# Patient Record
Sex: Female | Born: 1970 | Race: White | Hispanic: No | Marital: Married | Smoking: Never smoker
Health system: Southern US, Community
[De-identification: ages and names within clinical notes are randomized; demographics above are authoritative.]

## PROBLEM LIST (undated history)

## (undated) DIAGNOSIS — G43109 Migraine with aura, not intractable, without status migrainosus: Secondary | ICD-10-CM

## (undated) DIAGNOSIS — F319 Bipolar disorder, unspecified: Secondary | ICD-10-CM

## (undated) DIAGNOSIS — F329 Major depressive disorder, single episode, unspecified: Secondary | ICD-10-CM

## (undated) DIAGNOSIS — L4050 Arthropathic psoriasis, unspecified: Secondary | ICD-10-CM

## (undated) DIAGNOSIS — F32A Depression, unspecified: Secondary | ICD-10-CM

## (undated) DIAGNOSIS — G43909 Migraine, unspecified, not intractable, without status migrainosus: Secondary | ICD-10-CM

## (undated) DIAGNOSIS — F419 Anxiety disorder, unspecified: Secondary | ICD-10-CM

## (undated) DIAGNOSIS — U071 COVID-19: Secondary | ICD-10-CM

## (undated) DIAGNOSIS — I10 Essential (primary) hypertension: Secondary | ICD-10-CM

## (undated) HISTORY — DX: Bipolar disorder, unspecified: F31.9

## (undated) HISTORY — PX: TUBAL LIGATION: SHX77

## (undated) HISTORY — DX: Major depressive disorder, single episode, unspecified: F32.9

## (undated) HISTORY — DX: Depression, unspecified: F32.A

## (undated) HISTORY — DX: Migraine with aura, not intractable, without status migrainosus: G43.109

## (undated) HISTORY — PX: AUGMENTATION MAMMAPLASTY: SUR837

## (undated) HISTORY — DX: Anxiety disorder, unspecified: F41.9

## (undated) HISTORY — DX: Migraine, unspecified, not intractable, without status migrainosus: G43.909

## (undated) HISTORY — DX: Arthropathic psoriasis, unspecified: L40.50

## (undated) HISTORY — DX: COVID-19: U07.1

## (undated) HISTORY — PX: DILATION AND CURETTAGE OF UTERUS: SHX78

## (undated) HISTORY — DX: Essential (primary) hypertension: I10

## (undated) HISTORY — PX: SALPINGECTOMY: SHX328

---

## 2016-03-27 ENCOUNTER — Encounter: Payer: Self-pay | Admitting: Certified Nurse Midwife

## 2016-03-27 ENCOUNTER — Ambulatory Visit (INDEPENDENT_AMBULATORY_CARE_PROVIDER_SITE_OTHER): Payer: BLUE CROSS/BLUE SHIELD | Admitting: Certified Nurse Midwife

## 2016-03-27 ENCOUNTER — Other Ambulatory Visit: Payer: Self-pay | Admitting: Certified Nurse Midwife

## 2016-03-27 VITALS — BP 122/82 | HR 72 | Resp 16 | Ht 65.25 in | Wt 131.0 lb

## 2016-03-27 DIAGNOSIS — N946 Dysmenorrhea, unspecified: Secondary | ICD-10-CM

## 2016-03-27 DIAGNOSIS — Z3009 Encounter for other general counseling and advice on contraception: Secondary | ICD-10-CM | POA: Diagnosis not present

## 2016-03-27 DIAGNOSIS — Z23 Encounter for immunization: Secondary | ICD-10-CM

## 2016-03-27 DIAGNOSIS — Z124 Encounter for screening for malignant neoplasm of cervix: Secondary | ICD-10-CM | POA: Diagnosis not present

## 2016-03-27 DIAGNOSIS — Z9882 Breast implant status: Secondary | ICD-10-CM

## 2016-03-27 DIAGNOSIS — Z1231 Encounter for screening mammogram for malignant neoplasm of breast: Secondary | ICD-10-CM

## 2016-03-27 DIAGNOSIS — Z01419 Encounter for gynecological examination (general) (routine) without abnormal findings: Secondary | ICD-10-CM

## 2016-03-27 MED ORDER — ERRIN 0.35 MG PO TABS: 1.0000 | ORAL_TABLET | Freq: Every day | ORAL | 1 refills | Status: DC

## 2016-03-27 NOTE — Patient Instructions (Signed)

## 2016-03-27 NOTE — Progress Notes (Signed)
Encounter reviewed Jill Jertson, MD   

## 2016-03-27 NOTE — Progress Notes (Signed)
45 y.o. [id number]( one tubal pregnancy with left salpingectomy) Divorced  Caucasian Fe here to establish gyn care and for annual exam. Has just moved from [place] area. Establishing with MD practice soon. Periods scant to spotting, no regularity, but no migraine headaches or cramping with use, desires continuance. History of dysmenorrhea and menstrual migraine. No partner change, no STD screening desired or needed. Bipolar history with psychiatry management has established with one visit now in [place]. Sees rheumatology for psoriatic arthritis,has appointment scheduled. Plans to establish with for aex and labs soon. Declines any labs today. Will be starting new job in area soon.. No other health issues today. [employer] so far.  Patient's last menstrual period was 03/27/2016 (exact date).          Sexually active: Yes.    The current method of family planning is tubal ligation.    Exercising: Yes.    yoga Smoker:  yes  Health Maintenance: Pap:  8/16 neg per patient no abnormal pap smears. MMG:  8/16 neg per patient no abnormal mammograms Colonoscopy:  none BMD:   none TDaP: greater than 10 years request today. Shingles: no Pneumonia: no Hep C and HIV: both neg in the past Labs: none Self breast exam: done occ   reports that she has been smoking.  She has never used smokeless tobacco. She reports that she uses drugs, including Marijuana.  Past Medical History:  Diagnosis Date  . Anxiety   . Bipolar disorder ([place])   . Depression   . Migraines   . Psoriatic arthritis [facility])     Past Surgical History:  Procedure Laterality Date  . AUGMENTATION MAMMAPLASTY    . CESAREAN SECTION    . DILATION AND CURETTAGE OF UTERUS    . SALPINGECTOMY     left side  . TUBAL LIGATION      Current Outpatient Prescriptions  Medication Sig Dispense Refill  . almotriptan (AXERT) 12.5 MG tablet   4  . amphetamine-dextroamphetamine (ADDERALL) 20 MG tablet   0  . buPROPion (WELLBUTRIN SR) 100  MG 12 hr tablet Take 100 mg by mouth daily.    Marland Kitchen CIMZIA PREFILLED 2 X 200 MG/ML KIT   2  . clonazePAM (KLONOPIN) 1 MG tablet 2 at hs  3  . ERRIN 0.35 MG tablet   0  . lithium 300 MG tablet 1 in the am & 2 at hs  4  . LORazepam (ATIVAN) 1 MG tablet 1-2 twice daily  0  . MAGNESIUM PO Take 500 mg by mouth daily.    . methotrexate (RHEUMATREX) 2.5 MG tablet 5 pills once a week  0  . REXULTI 3 MG TABS   2   No current facility-administered medications for this visit.     History reviewed. No pertinent family history.  ROS:  Pertinent items are noted in HPI.  Otherwise, a comprehensive ROS was negative.  Exam:   BP 122/82   Pulse 72   Resp 16   Ht 5' 5.25" (1.657 m)   Wt 131 lb (59.4 kg)   LMP 03/27/2016 (Exact Date)   BMI 21.63 kg/m  Height: 5' 5.25" (165.7 cm) Ht Readings from Last 3 Encounters:  03/27/16 5' 5.25" (1.657 m)    General appearance: alert, cooperative and appears stated age Head: Normocephalic, without obvious abnormality, atraumatic Neck: no adenopathy, supple, symmetrical, trachea midline and thyroid normal to inspection and palpation Lungs: clear to auscultation bilaterally Breasts: normal appearance, no masses or tenderness, No nipple retraction or  dimpling, No nipple discharge or bleeding, No axillary or supraclavicular adenopathy, bilateral saline implants, appear intact Heart: regular rate and rhythm Abdomen: soft, non-tender; no masses,  no organomegaly Extremities: extremities normal, atraumatic, no cyanosis or edema Skin: Skin color, texture, turgor normal. No rashes or lesions Lymph nodes: Cervical, supraclavicular, and axillary nodes normal. No abnormal inguinal nodes palpated Neurologic: Grossly normal   Pelvic: External genitalia:  no lesions              Urethra:  normal appearing urethra with no masses, tenderness or lesions              Bartholin's and Skene's: normal                 Vagina: normal appearing vagina with normal color and  discharge, no lesions              Cervix: multiparous appearance, no cervical motion tenderness, no lesions and light menses noted              Pap taken: Yes.   Bimanual Exam:  Uterus:  normal size, contour, position, consistency, mobility, non-tender and retroverted              Adnexa: normal adnexa and no mass, fullness, tenderness               Rectovaginal: Confirms               Anus:  normal sphincter tone, no lesions  Chaperone present: yes  A:  Well Woman with normal exam  Contraception Tubal ligation,uses POP with migraine headache and dysmenorrhea control, desires continuance  Psoratic Arthritis with Rhuematology management, establishing care  Bipolar/depression management with Psychiatrist management has established care  Mammogram due given information to schedule  Immunization update  P:   Reviewed health and wellness pertinent to exam  Discussed risks and benefits of POP and side effects/bleeding profile with POP.Marland Kitchen Patient voiced understanding and would like to continue. Discussed current mammogram needed, but will renew for the next month(she has one refill).   Rx Errin see order with instructions  Continue follow up as indicated with MD's  Discussed risks and benefits of TDAP requests today.  Pap smear as above with HPVHR   counseled on breast self exam, mammography screening, STD prevention, HIV risk factors and prevention, adequate intake of calcium and vitamin D, diet and exercise  return annually or prn  An After Visit Summary was printed and given to the patient.

## 2016-03-29 ENCOUNTER — Ambulatory Visit
Admission: RE | Admit: 2016-03-29 | Discharge: 2016-03-29 | Disposition: A | Discharge status: Home / Self Care | Payer: BLUE CROSS/BLUE SHIELD | Source: Ambulatory Visit | Attending: Certified Nurse Midwife | Admitting: Certified Nurse Midwife

## 2016-03-29 ENCOUNTER — Ambulatory Visit: Payer: Self-pay

## 2016-03-29 DIAGNOSIS — Z9882 Breast implant status: Secondary | ICD-10-CM

## 2016-03-29 DIAGNOSIS — Z1231 Encounter for screening mammogram for malignant neoplasm of breast: Secondary | ICD-10-CM

## 2016-03-29 LAB — IPS PAP TEST WITH HPV

## 2016-04-03 DIAGNOSIS — Z8669 Personal history of other diseases of the nervous system and sense organs: Secondary | ICD-10-CM | POA: Insufficient documentation

## 2016-04-03 DIAGNOSIS — L4050 Arthropathic psoriasis, unspecified: Secondary | ICD-10-CM | POA: Insufficient documentation

## 2016-04-03 DIAGNOSIS — F319 Bipolar disorder, unspecified: Secondary | ICD-10-CM | POA: Insufficient documentation

## 2016-06-04 ENCOUNTER — Other Ambulatory Visit: Payer: Self-pay | Admitting: Certified Nurse Midwife

## 2016-06-04 DIAGNOSIS — N946 Dysmenorrhea, unspecified: Secondary | ICD-10-CM

## 2016-06-04 DIAGNOSIS — Z3009 Encounter for other general counseling and advice on contraception: Secondary | ICD-10-CM

## 2016-06-05 NOTE — Telephone Encounter (Signed)
Mammo has been done & is normal.

## 2016-06-05 NOTE — Telephone Encounter (Signed)
Medication refill request: Courtney Valentine Last AEX:  03/27/16 DL Next AEX: 04/03/17 DL Last MMG (if hormonal medication request): 03/29/17 BIRADS1, Density C[facility]  Refill authorized: 03/27/16 #1 Package 1R. Please advise. Thank you.   Routing to PG since DL is out of the office.

## 2016-10-10 ENCOUNTER — Encounter: Payer: Self-pay | Admitting: Certified Nurse Midwife

## 2016-10-10 ENCOUNTER — Ambulatory Visit (INDEPENDENT_AMBULATORY_CARE_PROVIDER_SITE_OTHER): Payer: PRIVATE HEALTH INSURANCE | Admitting: Certified Nurse Midwife

## 2016-10-10 VITALS — BP 118/80 | HR 64 | Resp 16 | Ht 65.25 in | Wt 126.0 lb

## 2016-10-10 DIAGNOSIS — N898 Other specified noninflammatory disorders of vagina: Secondary | ICD-10-CM | POA: Diagnosis not present

## 2016-10-10 DIAGNOSIS — N951 Menopausal and female climacteric states: Secondary | ICD-10-CM | POA: Diagnosis not present

## 2016-10-10 LAB — FOLLICLE STIMULATING HORMONE: FSH: 4.6 m[IU]/mL

## 2016-10-10 NOTE — Patient Instructions (Signed)
Perimenopause Perimenopause is the time when your body begins to move into the menopause (no menstrual period for 12 straight months). It is a natural process. Perimenopause can begin 2-8 years before the menopause and usually lasts for 1 year after the menopause. During this time, your ovaries may or may not produce an egg. The ovaries vary in their production of estrogen and progesterone hormones each month. This can cause irregular menstrual periods, difficulty getting pregnant, vaginal bleeding between periods, and uncomfortable symptoms. What are the causes?  Irregular production of the ovarian hormones, estrogen and progesterone, and not ovulating every month. Other causes include:  Tumor of the pituitary gland in the brain.  Medical disease that affects the ovaries.  Radiation treatment.  Chemotherapy.  Unknown causes.  Heavy smoking and excessive alcohol intake can bring on perimenopause sooner. What are the signs or symptoms?  Hot flashes.  Night sweats.  Irregular menstrual periods.  Decreased sex drive.  Vaginal dryness.  Headaches.  Mood swings.  Depression.  Memory problems.  Irritability.  Tiredness.  Weight gain.  Trouble getting pregnant.  The beginning of losing bone cells (osteoporosis).  The beginning of hardening of the arteries (atherosclerosis). How is this diagnosed? Your health care provider will make a diagnosis by analyzing your age, menstrual history, and symptoms. He or she will do a physical exam and note any changes in your body, especially your female organs. Female hormone tests may or may not be helpful depending on the amount of female hormones you produce and when you produce them. However, other hormone tests may be helpful to rule out other problems. How is this treated? In some cases, no treatment is needed. The decision on whether treatment is necessary during the perimenopause should be made by you and your health care  provider based on how the symptoms are affecting you and your lifestyle. Various treatments are available, such as:  Treating individual symptoms with a specific medicine for that symptom.  Herbal medicines that can help specific symptoms.  Counseling.  Group therapy. Follow these instructions at home:  Keep track of your menstrual periods (when they occur, how heavy they are, how long between periods, and how long they last) as well as your symptoms and when they started.  Only take over-the-counter or prescription medicines as directed by your health care provider.  Sleep and rest.  Exercise.  Eat a diet that contains calcium (good for your bones) and soy (acts like the estrogen hormone).  Do not smoke.  Avoid alcoholic beverages.  Take vitamin supplements as recommended by your health care provider. Taking vitamin E may help in certain cases.  Take calcium and vitamin D supplements to help prevent bone loss.  Group therapy is sometimes helpful.  Acupuncture may help in some cases. Contact a health care provider if:  You have questions about any symptoms you are having.  You need a referral to a specialist (gynecologist, psychiatrist, or psychologist). Get help right away if:  You have vaginal bleeding.  Your period lasts longer than 8 days.  Your periods are recurring sooner than 21 days.  You have bleeding after intercourse.  You have severe depression.  You have pain when you urinate.  You have severe headaches.  You have vision problems. This information is not intended to replace advice given to you by your health care provider. Make sure you discuss any questions you have with your health care provider. Document Released: 07/18/2004 Document Revised: 11/16/2015 Document Reviewed: 01/07/2013 Elsevier Interactive  Patient Education  2017 [employer].

## 2016-10-10 NOTE — Progress Notes (Signed)
46 y.o. Divorced Caucasian female [phone] here with complaint of vaginal symptoms of dryness and no moisture with sexual activity or with organisms. Uses lubricant without change.  Onset about 9 months ago and has gotten progressively worse. Same partner and his has also noted change. Back on Methotrexate, and Remicade. Continues on POP for Migraine prevention. Also on Lithium and Wellbutrin. No hot flashes or night sweats. Still has some brownish discharge once monthly and sometimes midcycle. No cramping with POP. Contraception  Salpingectomy. Has also noted dry eyes and mouth more frequently. Denies vaginal itching and odor. No other health issues today.  ROS; Pertinent to HPI  O:Healthy female WDWN Affect: normal, orientation x 3  Exam:Skin: warm and dry Abdomen:soft, non tender Inguinal Lymph nodes: no enlargement or tenderness Pelvic exam: External genital: normal female with dryness and slight scaling of around introitus, no lesions, slight redness, non tender BUS: negative Vagina: cottage cheese appearing discharge noted in posterior fornix, no odor. , Affirm taken Cervix: normal, non tender, no CMT Uterus: normal, non tender Adnexa:normal, non tender, no masses or fullness noted   A:Normal pelvic exam R/O vaginal infection Perimenopausal symptoms vs side effects of medication profile Vaginal dryness Oral and eye dryness   P:Discussed findings of vaginal dryness and increase vaginal discharge and etiology. Discussed medications can also increase dryness and decrease libido. Encouraged to discuss eye and oral dryness with Rheumatologist. Discussed OTC Biotene for oral dryness and Systane eye drops and can discuss with pharmacist also. Discussed etiology of perimenopausal changes and expectations.Pamphlet given. Also discussed POP use can also contribute to dryness, but feel her migraine headache prevention is also important..Discussed OTC products and coconut oil use for dryness and  lubrication. Instructions given .Questions addressed at length. Lab: Affirm, FSH  Rv prn

## 2016-10-11 ENCOUNTER — Telehealth: Payer: Self-pay

## 2016-10-11 LAB — WET PREP BY MOLECULAR PROBE
Candida species: NOT DETECTED
Gardnerella vaginalis: NOT DETECTED
Trichomonas vaginosis: NOT DETECTED

## 2016-10-11 NOTE — Telephone Encounter (Signed)
lmtcb

## 2016-10-11 NOTE — Telephone Encounter (Signed)
-----   Message from Regina Eck, CNM sent at 10/11/2016  8:16 AM EDT ----- Notify patient that [facility] does not show menopause or peri menopause, so hormonally normal.  Wet prep negative for yeast, BV and trichomonas. Feel vaginal dryness is the issue. Try the coconut oil we discussed if not change please advise. Her anxiety medications will cause decrease libido. This generally listed in the side effect profile on her RX handouts from pharmacy.

## 2016-10-11 NOTE — Telephone Encounter (Signed)
Patient notified of results. See lab

## 2016-10-12 NOTE — Progress Notes (Signed)
Encounter reviewed. If she doesn't improve, you could consider a trial of vaginal estrogen.  Sumner Boast, MD

## 2017-02-26 DIAGNOSIS — IMO0002 Reserved for concepts with insufficient information to code with codable children: Secondary | ICD-10-CM | POA: Insufficient documentation

## 2017-02-26 DIAGNOSIS — G43709 Chronic migraine without aura, not intractable, without status migrainosus: Secondary | ICD-10-CM | POA: Insufficient documentation

## 2017-04-03 ENCOUNTER — Ambulatory Visit: Payer: BLUE CROSS/BLUE SHIELD | Admitting: Certified Nurse Midwife

## 2017-05-08 ENCOUNTER — Other Ambulatory Visit: Payer: Self-pay

## 2017-05-08 DIAGNOSIS — Z3009 Encounter for other general counseling and advice on contraception: Secondary | ICD-10-CM

## 2017-05-08 DIAGNOSIS — N946 Dysmenorrhea, unspecified: Secondary | ICD-10-CM

## 2017-05-08 MED ORDER — ERRIN 0.35 MG PO TABS: 1.0000 | ORAL_TABLET | Freq: Every day | ORAL | 0 refills | Status: DC

## 2017-05-08 NOTE — Telephone Encounter (Signed)
Medication refill request: ERRIN Last AEX:  03/27/16 DL Next AEX: 05/13/17 Last MMG (if hormonal medication request): 03/29/16 BIRADS 1 negative/density c Refill authorized: 06/05/16 #3 w/3 refills; today please advise. Spoke with patient, per patient has enough pills until Sunday

## 2017-05-13 ENCOUNTER — Ambulatory Visit: Payer: PRIVATE HEALTH INSURANCE | Admitting: Certified Nurse Midwife

## 2017-05-13 ENCOUNTER — Encounter: Payer: Self-pay | Admitting: Certified Nurse Midwife

## 2017-05-13 NOTE — Progress Notes (Deleted)
46 y.o. [id number] Divorced  {Race/ethnicity:17218} Fe here for annual exam.    No LMP recorded. Patient is not currently having periods (Reason: Oral contraceptives).          Sexually active: {yes no:314532}  The current method of family planning is tubal ligation.    Exercising: {yes no:314532}  {types:19826} Smoker:  {YES NO:22349}  Health Maintenance: Pap:  8/16 neg per patient, 03-27-16 neg HPV HR neg History of Abnormal Pap: no MMG:  03-29-16 category c density birads 1:neg Self Breast exams: {YES NO:22349} Colonoscopy:  none BMD:   none TDaP:  *** Shingles: *** Pneumonia: *** Hep C and HIV: *** Labs: ***   reports that she has been smoking.  she has never used smokeless tobacco. She reports that she uses drugs. Drug: Marijuana.  Past Medical History:  Diagnosis Date  . Anxiety   . Bipolar disorder ([place])   . Depression   . Migraines   . Psoriatic arthritis [facility])     Past Surgical History:  Procedure Laterality Date  . AUGMENTATION MAMMAPLASTY    . CESAREAN SECTION    . DILATION AND CURETTAGE OF UTERUS    . SALPINGECTOMY     left side  . TUBAL LIGATION      Current Outpatient Medications  Medication Sig Dispense Refill  . almotriptan (AXERT) 12.5 MG tablet   4  . amphetamine-dextroamphetamine (ADDERALL) 20 MG tablet   0  . buPROPion (WELLBUTRIN SR) 100 MG 12 hr tablet Take 100 mg by mouth daily.    . clonazePAM (KLONOPIN) 1 MG tablet 2 at hs  3  . ERRIN 0.35 MG tablet Take 1 tablet (0.35 mg total) daily by mouth. 1 Package 0  . inFLIXimab (REMICADE) 100 MG injection     . lithium 300 MG tablet 1 in the am & 2 at hs  4  . MAGNESIUM PO Take 500 mg by mouth daily.    . methotrexate (RHEUMATREX) 2.5 MG tablet 5 pills once a week  0  . traZODone (DESYREL) 50 MG tablet Take 50 mg by mouth at bedtime.     No current facility-administered medications for this visit.     Family History  Problem Relation Age of Onset  . Diabetes Mother   . Hypertension Mother   .  Diabetes Father   . Hypertension Father   . Heart disease Maternal Grandfather   . Heart attack Maternal Grandfather   . Lung cancer Paternal Grandfather     ROS:  Pertinent items are noted in HPI.  Otherwise, a comprehensive ROS was negative.  Exam:   There were no vitals taken for this visit.   Ht Readings from Last 3 Encounters:  10/10/16 5' 5.25" (1.657 m)  03/27/16 5' 5.25" (1.657 m)    General appearance: alert, cooperative and appears stated age Head: Normocephalic, without obvious abnormality, atraumatic Neck: no adenopathy, supple, symmetrical, trachea midline and thyroid {EXAM; THYROID:18604} Lungs: clear to auscultation bilaterally Breasts: {Exam; breast:13139::"normal appearance, no masses or tenderness"} Heart: regular rate and rhythm Abdomen: soft, non-tender; no masses,  no organomegaly Extremities: extremities normal, atraumatic, no cyanosis or edema Skin: Skin color, texture, turgor normal. No rashes or lesions Lymph nodes: Cervical, supraclavicular, and axillary nodes normal. No abnormal inguinal nodes palpated Neurologic: Grossly normal   Pelvic: External genitalia:  no lesions              Urethra:  normal appearing urethra with no masses, tenderness or lesions  Bartholin's and Skene's: normal                 Vagina: normal appearing vagina with normal color and discharge, no lesions              Cervix: {exam; cervix:14595}              Pap taken: {yes no:314532} Bimanual Exam:  Uterus:  {exam; uterus:12215}              Adnexa: {exam; adnexa:12223}               Rectovaginal: Confirms               Anus:  normal sphincter tone, no lesions  Chaperone present: ***  A:  Well Woman with normal exam  P:   Reviewed health and wellness pertinent to exam  Pap smear: {YES NO:22349}  {plan; gyn:5269::"mammogram","pap smear","return annually or prn"}  An After Visit Summary was printed and given to the patient.

## 2017-07-04 ENCOUNTER — Other Ambulatory Visit: Payer: Self-pay | Admitting: Certified Nurse Midwife

## 2017-07-04 DIAGNOSIS — Z1231 Encounter for screening mammogram for malignant neoplasm of breast: Secondary | ICD-10-CM

## 2017-07-15 ENCOUNTER — Other Ambulatory Visit: Payer: Self-pay

## 2017-07-15 ENCOUNTER — Ambulatory Visit (INDEPENDENT_AMBULATORY_CARE_PROVIDER_SITE_OTHER): Payer: PRIVATE HEALTH INSURANCE | Admitting: Certified Nurse Midwife

## 2017-07-15 ENCOUNTER — Other Ambulatory Visit (HOSPITAL_COMMUNITY)
Admission: RE | Admit: 2017-07-15 | Discharge: 2017-07-15 | Disposition: A | Discharge status: Home / Self Care | Payer: PRIVATE HEALTH INSURANCE | Source: Ambulatory Visit | Attending: Certified Nurse Midwife | Admitting: Certified Nurse Midwife

## 2017-07-15 ENCOUNTER — Encounter: Payer: Self-pay | Admitting: Certified Nurse Midwife

## 2017-07-15 VITALS — BP 104/60 | HR 70 | Resp 16 | Ht 65.75 in | Wt 140.0 lb

## 2017-07-15 DIAGNOSIS — Z01419 Encounter for gynecological examination (general) (routine) without abnormal findings: Secondary | ICD-10-CM

## 2017-07-15 DIAGNOSIS — R6882 Decreased libido: Secondary | ICD-10-CM | POA: Diagnosis not present

## 2017-07-15 DIAGNOSIS — Z872 Personal history of diseases of the skin and subcutaneous tissue: Secondary | ICD-10-CM

## 2017-07-15 DIAGNOSIS — Z124 Encounter for screening for malignant neoplasm of cervix: Secondary | ICD-10-CM

## 2017-07-15 NOTE — Progress Notes (Signed)
47 y.o. [id number] Divorced  Caucasian Fe here for annual exam. Stopped OCP mid December and period in January was normal for her. She had not been having any period with OCP use other than increase brown discharge. Was using OCP for cycle control and headache control. Feels better being off OCP now. Sees Roe Coombs PCP for aex labs, hypertension,arthritis management and Dr. Toy Care for medication management of depression and Bipolar and migraine headache. All medications stable per patient. Complaining of bump in vulva area, chronic, no redness or pain in area. Has been using warm tub baths for relief and feels better now. She shaves in this area and notices this more frequently with shaving. Some decrease in libido, but aware her medications can increase the risk for this. No other health concerns today.  Patient's last menstrual period was 07/01/2017 (exact date).          Sexually active: Yes.    The current method of family planning is tubal ligation.    Exercising: No.  exercise Smoker:  no  Health Maintenance: Pap:  8/16 neg,03-27-16 neg HPV HR neg History of Abnormal Pap: no MMG:  03-29-16 category c density birads 1:neg Self Breast exams: no Colonoscopy:  none BMD:   none TDaP:  2017 Shingles: no Pneumonia: no Hep C and HIV: both neg in the past per patient Labs: with PCP   reports that she has quit smoking. she has never used smokeless tobacco. She reports that she does not drink alcohol or use drugs.  Past Medical History:  Diagnosis Date  . Anxiety   . Bipolar disorder ([place])   . Depression   . Hypertension   . Migraines   . Migraines   . Psoriatic arthritis [facility])     Past Surgical History:  Procedure Laterality Date  . AUGMENTATION MAMMAPLASTY    . CESAREAN SECTION    . DILATION AND CURETTAGE OF UTERUS    . SALPINGECTOMY     left side  . TUBAL LIGATION      Current Outpatient Medications  Medication Sig Dispense Refill  . almotriptan (AXERT) 12.5 MG tablet   4  .  amphetamine-dextroamphetamine (ADDERALL XR) 30 MG 24 hr capsule Take by mouth.    Marland Kitchen atenolol (TENORMIN) 25 MG tablet Take 1 pill BID    . baclofen (LIORESAL) 10 MG tablet Take by mouth 2 (two) times daily.    . Botulinum Toxin Type A (BOTOX) 200 units SOLR     . buPROPion (WELLBUTRIN SR) 100 MG 12 hr tablet Take 100 mg by mouth daily.    . clonazePAM (KLONOPIN) 1 MG tablet 2 at hs  3  . diclofenac (VOLTAREN) 25 MG EC tablet Take 1 pill up to q12h prn severe Headache    . inFLIXimab (REMICADE) 100 MG injection     . LATUDA 40 MG TABS tablet   1  . lithium 300 MG tablet 2 in the am & 2 at hs  4  . MAGNESIUM PO Take 500 mg by mouth daily.    Marland Kitchen OLANZapine-FLUoxetine (SYMBYAX) 3-25 MG capsule     . traZODone (DESYREL) 50 MG tablet Take 50 mg by mouth at bedtime.     No current facility-administered medications for this visit.     Family History  Problem Relation Age of Onset  . Diabetes Mother   . Hypertension Mother   . Diabetes Father   . Hypertension Father   . Alzheimer's disease Father   . Heart disease Maternal Grandfather   .  Heart attack Maternal Grandfather   . Lung cancer Paternal Grandfather     ROS:  Pertinent items are noted in HPI.  Otherwise, a comprehensive ROS was negative.  Exam:   BP 104/60   Pulse 70   Resp 16   Ht 5' 5.75" (1.67 m)   Wt 140 lb (63.5 kg)   LMP 07/01/2017 (Exact Date)   BMI 22.77 kg/m  Height: 5' 5.75" (167 cm) Ht Readings from Last 3 Encounters:  07/15/17 5' 5.75" (1.67 m)  10/10/16 5' 5.25" (1.657 m)  03/27/16 5' 5.25" (1.657 m)    General appearance: alert, cooperative and appears stated age Head: Normocephalic, without obvious abnormality, atraumatic Neck: no adenopathy, supple, symmetrical, trachea midline and thyroid normal to inspection and palpation Lungs: clear to auscultation bilaterally Breasts: normal appearance, no masses or tenderness, No nipple retraction or dimpling, No nipple discharge or bleeding, No axillary or  supraclavicular adenopathy Heart: regular rate and rhythm Abdomen: soft, non-tender; no masses,  no organomegaly Extremities: extremities normal, atraumatic, no cyanosis or edema Skin: Skin color, texture, turgor normal. No rashes or lesions Lymph nodes: Cervical, supraclavicular, and axillary nodes normal. No abnormal inguinal nodes palpated Neurologic: Grossly normal   Pelvic: External genitalia:  no lesions              Urethra:  normal appearing urethra with no masses, tenderness or lesions              Bartholin's and Skene's: normal                 Vagina: normal appearing vagina with normal color and discharge, no lesions              Cervix: no cervical motion tenderness and no lesions              Pap taken: Yes.   Bimanual Exam:  Uterus:  normal size, contour, position, consistency, mobility, non-tender              Adnexa: normal adnexa and no mass, fullness, tenderness               Rectovaginal: Confirms               Anus:  normal sphincter tone, no lesions  Chaperone present: yes  A:  Well Woman with normal exam  Contraception tubal  Previous OCP use for cycle control and headache management, stopped and having no issues at present. Does not desire restart.   Decrease libido  Hypertension,arthritis, bipolar,depression , migraine headache with MD management  P:   Reviewed health and wellness pertinent to exam  Discussed decrease libido and no medication that has been found to help with this. Discussed testosterone trial,but patient not interested. Discussed [facility] sexual therapy as option or trying for date night periodically and discussing her concerns with spouse. Patient will advise if needs further advise.  Continue follow up as indicated with PCP  Pap smear: no   counseled on breast self exam, mammography screening, feminine hygiene, adequate intake of calcium and vitamin D, diet and exercise  return annually or prn  An After Visit Summary was printed and  given to the patient.

## 2017-07-15 NOTE — Patient Instructions (Signed)

## 2017-07-16 LAB — CYTOLOGY - PAP
Diagnosis: NEGATIVE
HPV: NOT DETECTED

## 2017-07-28 ENCOUNTER — Ambulatory Visit
Admission: RE | Admit: 2017-07-28 | Discharge: 2017-07-28 | Disposition: A | Discharge status: Home / Self Care | Payer: PRIVATE HEALTH INSURANCE | Source: Ambulatory Visit | Attending: Certified Nurse Midwife | Admitting: Certified Nurse Midwife

## 2017-07-28 DIAGNOSIS — Z1231 Encounter for screening mammogram for malignant neoplasm of breast: Secondary | ICD-10-CM

## 2018-02-16 ENCOUNTER — Other Ambulatory Visit: Payer: Self-pay | Admitting: Internal Medicine

## 2018-02-16 ENCOUNTER — Ambulatory Visit
Admission: RE | Admit: 2018-02-16 | Discharge: 2018-02-16 | Disposition: A | Discharge status: Home / Self Care | Payer: PRIVATE HEALTH INSURANCE | Source: Ambulatory Visit | Attending: Internal Medicine | Admitting: Internal Medicine

## 2018-02-16 DIAGNOSIS — M7989 Other specified soft tissue disorders: Secondary | ICD-10-CM

## 2018-02-16 DIAGNOSIS — M79602 Pain in left arm: Secondary | ICD-10-CM

## 2018-10-19 ENCOUNTER — Ambulatory Visit (INDEPENDENT_AMBULATORY_CARE_PROVIDER_SITE_OTHER): Payer: PRIVATE HEALTH INSURANCE | Admitting: Cardiology

## 2018-10-19 ENCOUNTER — Other Ambulatory Visit: Payer: Self-pay

## 2018-10-19 ENCOUNTER — Encounter: Payer: Self-pay | Admitting: Cardiology

## 2018-10-19 VITALS — BP 138/72 | Ht 65.0 in | Wt 155.0 lb

## 2018-10-19 DIAGNOSIS — R5383 Other fatigue: Secondary | ICD-10-CM | POA: Diagnosis not present

## 2018-10-19 DIAGNOSIS — I10 Essential (primary) hypertension: Secondary | ICD-10-CM | POA: Diagnosis not present

## 2018-10-19 DIAGNOSIS — R0609 Other forms of dyspnea: Secondary | ICD-10-CM

## 2018-10-19 DIAGNOSIS — L4050 Arthropathic psoriasis, unspecified: Secondary | ICD-10-CM

## 2018-10-19 NOTE — Progress Notes (Signed)
Subjective:   Courtney Valentine, female    DOB: 09/20/1970, 48 y.o.   MRN: [id number]  Chief Complaint  Patient presents with  . Shortness of Breath  . Fatigue  . New Patient (Initial Visit)   This visit type was conducted due to national recommendations for restrictions regarding the COVID-19 Pandemic (e.g. social distancing).  This format is felt to be most appropriate for this patient at this time.  All issues noted in this document were discussed and addressed.  No physical exam was performed (except for noted visual exam findings with Telehealth visits).  The patient has consented to conduct a Telehealth visit and understands insurance will be billed.   I discussed the limitations of evaluation and management by telemedicine and the availability of in person appointments. The patient expressed understanding and agreed to proceed.  Virtual Visit via Video Note is as below  I connected with Courtney Valentine, on 10/20/18 at 1130 by a video enabled telemedicine application and verified that I am speaking with the correct person using two identifiers.     I have discussed with her regarding the safety during COVID Pandemic and steps and precautions including social distancing with the patient.    Patient referred by Roe Coombs C for fatigue and dyspnea on exertion  HPI:  Courtney Valentine is a 48 y.o. female with bipolar disorder, psoriatic arthritis on Remicade, chronic migraines, and hypertension.  Patient was recently evaluated by her PCP and mentioned shortness of breath on exertion particularly with climbing stairs. Does notice that her previously tolerated home exercises, she now has to stop and rest. Symptoms started a few months ago and have continued to progress. No chest pain. No PND or orthopnea. Also having fatigue that was felt to be related to side effect from her atenolol as dosage was recently changed and was noted to be bradycardic in PCP office; however, fatigue  has continued with going back to twice a day dose. She was also recently found to have anemia without known cause. Denies any bleeding. Does have chronic palpitations that last for a few seconds and describes as heart racing. Palpitations have been present for the last 1 year and have not recently worsened. Occur sporadically.  She currently has referral to pulmonary for evaluation of possible sleep apnea as she has started snoring over the last year. States that she is exhausted all the time and does not wake up feeling well rested. She is scheduled for a visit with Dr. Michela Pitcher with [facility] tomorrow for evaluation. She has history of hypertension that has been fairly well controlled. Denies any hyperlipidemia, diabetes, or thyroid disorders.  Past Medical History:  Diagnosis Date  . Anxiety   . Bipolar disorder ([place])   . Depression   . Hypertension   . Migraines   . Migraines   . Psoriatic arthritis [facility])     Past Surgical History:  Procedure Laterality Date  . AUGMENTATION MAMMAPLASTY Bilateral   . CESAREAN SECTION    . DILATION AND CURETTAGE OF UTERUS    . SALPINGECTOMY     left side  . TUBAL LIGATION      Family History  Problem Relation Age of Onset  . Diabetes Mother   . Hypertension Mother   . Diabetes Father   . Hypertension Father   . Alzheimer's disease Father   . Heart disease Maternal Grandfather   . Heart attack Maternal Grandfather   . Lung cancer Paternal Grandfather  Social History   Socioeconomic History  . Marital status: Married    Spouse name: Not on file  . Number of children: 2  . Years of education: Not on file  . Highest education level: Not on file  Occupational History  . Not on file  Social Needs  . Financial resource strain: Not on file  . Food insecurity:    Worry: Not on file    Inability: Not on file  . Transportation needs:    Medical: Not on file    Non-medical: Not on file  Tobacco Use  . Smoking status: Never Smoker   . Smokeless tobacco: Never Used  Substance and Sexual Activity  . Alcohol use: No    Frequency: Never  . Drug use: No  . Sexual activity: Yes    Partners: Male    Birth control/protection: Surgical    Comment: BTL  Lifestyle  . Physical activity:    Days per week: Not on file    Minutes per session: Not on file  . Stress: Not on file  Relationships  . Social connections:    Talks on phone: Not on file    Gets together: Not on file    Attends religious service: Not on file    Active member of club or organization: Not on file    Attends meetings of clubs or organizations: Not on file    Relationship status: Not on file  . Intimate partner violence:    Fear of current or ex partner: Not on file    Emotionally abused: Not on file    Physically abused: Not on file    Forced sexual activity: Not on file  Other Topics Concern  . Not on file  Social History Narrative  . Not on file    Current Outpatient Medications on File Prior to Visit  Medication Sig Dispense Refill  . almotriptan (AXERT) 12.5 MG tablet as needed.   4  . amphetamine-dextroamphetamine (ADDERALL XR) 30 MG 24 hr capsule Take by mouth daily.     Marland Kitchen atenolol (TENORMIN) 25 MG tablet Take 1 pill BID    . Botulinum Toxin Type A (BOTOX) 200 units SOLR Every 3 months    . buPROPion (WELLBUTRIN SR) 100 MG 12 hr tablet Take 100 mg by mouth daily.    . clonazePAM (KLONOPIN) 1 MG tablet 1 mg daily. 2 at hs  3  . inFLIXimab (REMICADE) 100 MG injection Every 8 weeks    . LATUDA 40 MG TABS tablet daily.   1  . OLANZapine-FLUoxetine (SYMBYAX) 3-25 MG capsule daily.     . traZODone (DESYREL) 50 MG tablet Take 50 mg by mouth at bedtime.    Marland Kitchen lithium 300 MG tablet 2 in the am & 2 at hs  4  . MAGNESIUM PO Take 500 mg by mouth daily.     No current facility-administered medications on file prior to visit.      Review of Systems  Constitution: Positive for malaise/fatigue. Negative for decreased appetite, weight gain and  weight loss.  Eyes: Negative for visual disturbance.  Cardiovascular: Positive for dyspnea on exertion and palpitations. Negative for chest pain, claudication, leg swelling, orthopnea and syncope.  Respiratory: Positive for snoring. Negative for hemoptysis and wheezing.   Endocrine: Negative for cold intolerance and heat intolerance.  Hematologic/Lymphatic: Does not bruise/bleed easily.  Skin: Negative for nail changes.  Musculoskeletal: Positive for joint pain. Negative for muscle weakness and myalgias.  Gastrointestinal: Negative for abdominal pain,  change in bowel habit, nausea and vomiting.  Neurological: Positive for headaches. Negative for difficulty with concentration, dizziness and focal weakness.  Psychiatric/Behavioral: Negative for altered mental status and suicidal ideas.  All other systems reviewed and are negative.      Objective:     Blood pressure 138/72, height _0  (1.651 m), weight 155 lb (70.3 kg).  Physical Exam  Constitutional: She is oriented to person, place, and time. She appears well-developed and well-nourished. No distress.  Pulmonary/Chest: Effort normal. No respiratory distress.  Musculoskeletal:        General: Edema (mild ankle edema) present.  Neurological: She is alert and oriented to person, place, and time.  Psychiatric: She has a normal mood and affect. Her behavior is normal.    Radiology:  Cardiac studies:     Laboratory examination:  CBC 10/13/2018: RBC 3.74, normal H and H, CBC otherwise normal.  CMP  10/13/2018: Glucose 105, creatinine 0.90, eGFR 77/89, potassium 3.9, BMP normal.  Lipid 03/04/2017: Cholesterol 115, triglycerides 61, HDL 45, LDL 58.       Assessment & Recommendations:   Dyspnea on exertion - Plan: PCV CARDIAC STRESS TEST, PCV ECHOCARDIOGRAM COMPLETE  Fatigue, unspecified type  Essential hypertension  Psoriatic arthritis ([place])   Recommendations:  Patient was seen virtually for evaluation of newly noted  dyspnea on exertion, fatigue, and decreased exercise tolerance.  While patient has many possible etiologies for her symptoms, in view of her psoriatic arthritis, pulmonary hypertension should be excluded.  Has minimal leg edema on exam today.  We will schedule for echocardiogram in the next few weeks for further evaluation.  She has not had any symptoms of chest pain. Although my suspicion for CAD is lower, she does have some risk factors for this and her dyspnea could be angina equivalent. Will perform treadmill stress test for further evaluation.    Agree with evaluation for possible underlying sleep apnea that could also be etiology for her symptoms.  By PCP note, EKG was normal except for bradycardia. Unsure if bradycardia was etiology for her symptoms as she states her symptoms started prior to increased dose of atenolol, but would recommend continuing with twice a day dose for now.  She has noted blood pressure elevations, and is minimally elevated today.  We will continue to closely monitor.  No changes were made to medications today.  Symptoms of palpitations are unchanged and likely suggestive of PVCs; however, if symptoms worsen, could consider event monitor.  We will schedule for an office visit in 4 to 6 weeks, hopefully we can at least have her echocardiogram performed at this time.  Encouraged her to contact me for any questions, concerns or worsening symptoms.   Thank you for referring this patient. Please do not hesitate to contact me for any questions.    *I have discussed this case with Dr. Einar Gip and he participated in formulating the plan.Jeri Lager, MSN, APRN, FNP-C [facility], [place] Office: [phone] Fax: [phone]

## 2018-10-20 ENCOUNTER — Encounter: Payer: Self-pay | Admitting: Cardiology

## 2018-11-03 ENCOUNTER — Ambulatory Visit (INDEPENDENT_AMBULATORY_CARE_PROVIDER_SITE_OTHER): Payer: PRIVATE HEALTH INSURANCE

## 2018-11-03 ENCOUNTER — Other Ambulatory Visit: Payer: Self-pay

## 2018-11-03 DIAGNOSIS — R0609 Other forms of dyspnea: Secondary | ICD-10-CM | POA: Diagnosis not present

## 2018-11-04 ENCOUNTER — Other Ambulatory Visit: Payer: PRIVATE HEALTH INSURANCE

## 2018-11-26 ENCOUNTER — Ambulatory Visit: Payer: PRIVATE HEALTH INSURANCE | Admitting: Cardiology

## 2018-12-22 ENCOUNTER — Other Ambulatory Visit: Payer: Self-pay | Admitting: Certified Nurse Midwife

## 2018-12-22 DIAGNOSIS — Z1231 Encounter for screening mammogram for malignant neoplasm of breast: Secondary | ICD-10-CM

## 2019-02-01 ENCOUNTER — Ambulatory Visit: Payer: PRIVATE HEALTH INSURANCE

## 2019-03-11 ENCOUNTER — Other Ambulatory Visit: Payer: Self-pay

## 2019-03-11 ENCOUNTER — Ambulatory Visit
Admission: RE | Admit: 2019-03-11 | Discharge: 2019-03-11 | Disposition: A | Discharge status: Home / Self Care | Payer: PRIVATE HEALTH INSURANCE | Source: Ambulatory Visit | Attending: Certified Nurse Midwife | Admitting: Certified Nurse Midwife

## 2019-03-11 DIAGNOSIS — Z1231 Encounter for screening mammogram for malignant neoplasm of breast: Secondary | ICD-10-CM

## 2019-04-19 ENCOUNTER — Other Ambulatory Visit: Payer: Self-pay

## 2019-04-20 ENCOUNTER — Ambulatory Visit (INDEPENDENT_AMBULATORY_CARE_PROVIDER_SITE_OTHER): Payer: PRIVATE HEALTH INSURANCE | Admitting: Certified Nurse Midwife

## 2019-04-20 ENCOUNTER — Encounter: Payer: Self-pay | Admitting: Certified Nurse Midwife

## 2019-04-20 ENCOUNTER — Other Ambulatory Visit: Payer: Self-pay

## 2019-04-20 VITALS — BP 104/64 | HR 60 | Temp 97.1°F | Resp 16 | Ht 65.25 in | Wt 142.0 lb

## 2019-04-20 DIAGNOSIS — Z01419 Encounter for gynecological examination (general) (routine) without abnormal findings: Secondary | ICD-10-CM | POA: Diagnosis not present

## 2019-04-20 DIAGNOSIS — N912 Amenorrhea, unspecified: Secondary | ICD-10-CM | POA: Diagnosis not present

## 2019-04-20 DIAGNOSIS — E559 Vitamin D deficiency, unspecified: Secondary | ICD-10-CM | POA: Diagnosis not present

## 2019-04-20 DIAGNOSIS — N951 Menopausal and female climacteric states: Secondary | ICD-10-CM

## 2019-04-20 DIAGNOSIS — Z0000 Encounter for general adult medical examination without abnormal findings: Secondary | ICD-10-CM | POA: Diagnosis not present

## 2019-04-20 DIAGNOSIS — N390 Urinary tract infection, site not specified: Secondary | ICD-10-CM

## 2019-04-20 LAB — POCT URINALYSIS DIPSTICK
Bilirubin, UA: NEGATIVE
Blood, UA: NEGATIVE
Glucose, UA: NEGATIVE
Ketones, UA: NEGATIVE
Leukocytes, UA: NEGATIVE
Nitrite, UA: NEGATIVE
Protein, UA: NEGATIVE
Urobilinogen, UA: NEGATIVE E.U./dL — AB
pH, UA: 5 (ref 5.0–8.0)

## 2019-04-20 NOTE — Progress Notes (Addendum)
48 y.o. [id number] Married  Caucasian Fe here for annual exam. Periods none since July 2020. Denies hot flashes or night sweats. Has noted no libido for the past 3-6 months and some vaginal dryness. Sees PCP for aex, hypertension and Migraine management. and labs. Sees Psychiatrist for Bipolar, anxiety medication. All medication stable per patient. Has noted some odor in her urine in the past 24 hours, denies frequency or urgency. Drinking water mainly. Denies fever or chills or back pain. No other health issues.  No LMP recorded.          Sexually active: Yes.    The current method of family planning is tubal ligation.    Exercising: Yes.    2-3 times a week Smoker:  no  Review of Systems  Constitutional: Negative.   HENT: Negative.   Eyes: Negative.   Respiratory: Negative.   Cardiovascular: Negative.   Gastrointestinal: Negative.   Genitourinary: Negative.   Musculoskeletal: Negative.   Skin: Negative.   Neurological: Negative.   Endo/Heme/Allergies: Negative.   Psychiatric/Behavioral: Negative.     Health Maintenance: Pap:  03-27-16 neg HPV HR neg, 07-15-17 neg HPV HR neg History of Abnormal Pap: no MMG:  03-11-2019 category b density birads 1:neg Self Breast exams: no Colonoscopy:  none BMD:   none TDaP:  2017 Shingles: no Pneumonia: no Hep C and HIV: both neg in the past per patient Labs: poct urine-neg, labs done   reports that she has never smoked. She has never used smokeless tobacco. She reports that she does not drink alcohol or use drugs.  Past Medical History:  Diagnosis Date  . Anxiety   . Bipolar disorder ([place])   . Depression   . Hypertension   . Migraines   . Migraines   . Psoriatic arthritis [facility])     Past Surgical History:  Procedure Laterality Date  . AUGMENTATION MAMMAPLASTY Bilateral   . CESAREAN SECTION    . DILATION AND CURETTAGE OF UTERUS    . SALPINGECTOMY     left side  . TUBAL LIGATION      Current Outpatient Medications  Medication Sig  Dispense Refill  . almotriptan (AXERT) 12.5 MG tablet as needed.   4  . amphetamine-dextroamphetamine (ADDERALL XR) 30 MG 24 hr capsule Take by mouth daily.     Marland Kitchen atenolol (TENORMIN) 25 MG tablet Take 1 pill BID    . Botulinum Toxin Type A (BOTOX) 200 units SOLR Every 3 months    . buPROPion (WELLBUTRIN SR) 100 MG 12 hr tablet Take 100 mg by mouth daily.    . clonazePAM (KLONOPIN) 1 MG tablet 1 mg daily. 2 at hs  3  . inFLIXimab (REMICADE) 100 MG injection Every 8 weeks    . LATUDA 40 MG TABS tablet daily.   1  . lithium 300 MG tablet 2 in the am & 2 at hs  4  . MAGNESIUM PO Take 500 mg by mouth daily.    Marland Kitchen OLANZapine-FLUoxetine (SYMBYAX) 3-25 MG capsule daily.     . traZODone (DESYREL) 50 MG tablet Take 50 mg by mouth at bedtime.     No current facility-administered medications for this visit.     Family History  Problem Relation Age of Onset  . Diabetes Mother   . Hypertension Mother   . Diabetes Father   . Hypertension Father   . Alzheimer's disease Father   . Heart disease Maternal Grandfather   . Heart attack Maternal Grandfather   . Lung cancer  Paternal Grandfather     ROS:  Pertinent items are noted in HPI.  Otherwise, a comprehensive ROS was negative.  Exam:   There were no vitals taken for this visit.   Ht Readings from Last 3 Encounters:  10/19/18 5' 5"  (1.651 m)  07/15/17 5' 5.75" (1.67 m)  10/10/16 5' 5.25" (1.657 m)    General appearance: alert, cooperative and appears stated age Head: Normocephalic, without obvious abnormality, atraumatic Neck: no adenopathy, supple, symmetrical, trachea midline and thyroid normal to inspection and palpation Lungs: clear to auscultation bilaterally Breasts: normal appearance, no masses or tenderness, No nipple retraction or dimpling, No nipple discharge or bleeding, No axillary or supraclavicular adenopathy, implants feel intact Heart: regular rate and rhythm Abdomen: soft, non-tender; no masses,  no organomegaly Bladder  tender to palpation Extremities: extremities normal, atraumatic, no cyanosis or edema Skin: Skin color, texture, turgor normal. No rashes or lesions Lymph nodes: Cervical, supraclavicular, and axillary nodes normal. No abnormal inguinal nodes palpated Neurologic: Grossly normal   Pelvic: External genitalia:  no lesions              Urethra:  normal appearing urethra with no masses, tenderness or lesions              Bartholin's and Skene's: normal                 Vagina: normal appearing vagina with normal color and discharge, no lesions              Cervix: no cervical motion tenderness, no lesions and normal appearance              Pap taken: No. Bimanual Exam:  Uterus:  normal size, contour, position, consistency, mobility, non-tender and anteverted              Adnexa: normal adnexa and no mass, fullness, tenderness               Rectovaginal: Confirms               Anus:  normal sphincter tone, no lesions  Chaperone present: yes  A:  Well Woman with normal exam  Contraception tubal ligation  Amenorrhea negative UPT  Perimenopausal?  UTI Depression/hypertension/migraine management with MD. All stable  Screening labs  P:   Reviewed health and wellness pertinent to exam  Discussed perimenopausal changes with cycle a possible cause in addition to thyroid or pituitary changes. Will evaluate with labs. Discussed possible Provera challenge if indicated. Printed information given.  Labs: TSH,FSH,Prolactin  R/O UTI urine negative, but slight symptoms Lab: Urine culture  Warning signs with UTI given and need to increase water intake.  Continue follow up with MD as indicated.  Labs; Lipid panel, CBC, CMP  Pap smear: no  counseled on breast self exam, mammography screening, feminine hygiene, osteoporosis, diet and exercise, calcium and Vitamin D in diet, Kegel's exercises  return annually or prn  An After Visit Summary was printed and given to the patient.   Note in regards to  amenorrhea: noted on MRI she has a 3 mm microadenoma per referral note from [place]. She will be referred to Dr. Karn Pickler) for management.

## 2019-04-20 NOTE — Patient Instructions (Signed)
EXERCISE AND DIET:  We recommended that you start or continue a regular exercise program for good health. Regular exercise means any activity that makes your heart beat faster and makes you sweat.  We recommend exercising at least 30 minutes per day at least 3 days a week, preferably 4 or 5.  We also recommend a diet low in fat and sugar.  Inactivity, poor dietary choices and obesity can cause diabetes, heart attack, stroke, and kidney damage, among others.    ALCOHOL AND SMOKING:  Women should limit their alcohol intake to no more than 7 drinks/beers/glasses of wine (combined, not each!) per week. Moderation of alcohol intake to this level decreases your risk of breast cancer and liver damage. And of course, no recreational drugs are part of a healthy lifestyle.  And absolutely no smoking or even second hand smoke. Most people know smoking can cause heart and lung diseases, but did you know it also contributes to weakening of your bones? Aging of your skin?  Yellowing of your teeth and nails?  CALCIUM AND VITAMIN D:  Adequate intake of calcium and Vitamin D are recommended.  The recommendations for exact amounts of these supplements seem to change often, but generally speaking 600 mg of calcium (either carbonate or citrate) and 800 units of Vitamin D per day seems prudent. Certain women may benefit from higher intake of Vitamin D.  If you are among these women, your doctor will have told you during your visit.    PAP SMEARS:  Pap smears, to check for cervical cancer or precancers,  have traditionally been done yearly, although recent scientific advances have shown that most women can have pap smears less often.  However, every woman still should have a physical exam from her gynecologist every year. It will include a breast check, inspection of the vulva and vagina to check for abnormal growths or skin changes, a visual exam of the cervix, and then an exam to evaluate the size and shape of the uterus and  ovaries.  And after 47 years of age, a rectal exam is indicated to check for rectal cancers. We will also provide age appropriate advice regarding health maintenance, like when you should have certain vaccines, screening for sexually transmitted diseases, bone density testing, colonoscopy, mammograms, etc.   MAMMOGRAMS:  All women over 40 years old should have a yearly mammogram. Many facilities now offer a "3D" mammogram, which may cost around $50 extra out of pocket. If possible,  we recommend you accept the option to have the 3D mammogram performed.  It both reduces the number of women who will be called back for extra views which then turn out to be normal, and it is better than the routine mammogram at detecting truly abnormal areas.    COLONOSCOPY:  Colonoscopy to screen for colon cancer is recommended for all women at age 50.  We know, you hate the idea of the prep.  We agree, BUT, having colon cancer and not knowing it is worse!!  Colon cancer so often starts as a polyp that can be seen and removed at colonscopy, which can quite literally save your life!  And if your first colonoscopy is normal and you have no family history of colon cancer, most women don't have to have it again for 10 years.  Once every ten years, you can do something that may end up saving your life, right?  We will be happy to help you get it scheduled when you are ready.    Be sure to check your insurance coverage so you understand how much it will cost.  It may be covered as a preventative service at no cost, but you should check your particular policy.      Urinary Tract Infection, Adult A urinary tract infection (UTI) is an infection of any part of the urinary tract. The urinary tract includes:  The kidneys.  The ureters.  The bladder.  The urethra. These organs make, store, and get rid of pee (urine) in the body. What are the causes? This is caused by germs (bacteria) in your genital area. These germs grow and  cause swelling (inflammation) of your urinary tract. What increases the risk? You are more likely to develop this condition if:  You have a small, thin tube (catheter) to drain pee.  You cannot control when you pee or poop (incontinence).  You are female, and: ? You use these methods to prevent pregnancy: ? A medicine that kills sperm (spermicide). ? A device that blocks sperm (diaphragm). ? You have low levels of a female hormone (estrogen). ? You are pregnant.  You have genes that add to your risk.  You are sexually active.  You take antibiotic medicines.  You have trouble peeing because of: ? A prostate that is bigger than normal, if you are female. ? A blockage in the part of your body that drains pee from the bladder (urethra). ? A kidney stone. ? A nerve condition that affects your bladder (neurogenic bladder). ? Not getting enough to drink. ? Not peeing often enough.  You have other conditions, such as: ? Diabetes. ? A weak disease-fighting system (immune system). ? Sickle cell disease. ? Gout. ? Injury of the spine. What are the signs or symptoms? Symptoms of this condition include:  Needing to pee right away (urgently).  Peeing often.  Peeing small amounts often.  Pain or burning when peeing.  Blood in the pee.  Pee that smells bad or not like normal.  Trouble peeing.  Pee that is cloudy.  Fluid coming from the vagina, if you are female.  Pain in the belly or lower back. Other symptoms include:  Throwing up (vomiting).  No urge to eat.  Feeling mixed up (confused).  Being tired and grouchy (irritable).  A fever.  Watery poop (diarrhea). How is this treated? This condition may be treated with:  Antibiotic medicine.  Other medicines.  Drinking enough water. Follow these instructions at home:  Medicines  Take over-the-counter and prescription medicines only as told by your doctor.  If you were prescribed an antibiotic medicine,  take it as told by your doctor. Do not stop taking it even if you start to feel better. General instructions  Make sure you: ? Pee until your bladder is empty. ? Do not hold pee for a long time. ? Empty your bladder after sex. ? Wipe from front to back after pooping if you are a female. Use each tissue one time when you wipe.  Drink enough fluid to keep your pee pale yellow.  Keep all follow-up visits as told by your doctor. This is important. Contact a doctor if:  You do not get better after 1-2 days.  Your symptoms go away and then come back. Get help right away if:  You have very bad back pain.  You have very bad pain in your lower belly.  You have a fever.  You are sick to your stomach (nauseous).  You are throwing up. Summary  A  urinary tract infection (UTI) is an infection of any part of the urinary tract.  This condition is caused by germs in your genital area.  There are many risk factors for a UTI. These include having a small, thin tube to drain pee and not being able to control when you pee or poop.  Treatment includes antibiotic medicines for germs.  Drink enough fluid to keep your pee pale yellow. This information is not intended to replace advice given to you by your health care provider. Make sure you discuss any questions you have with your health care provider. Document Released: 11/27/2007 Document Revised: 05/28/2018 Document Reviewed: 12/18/2017 Elsevier Patient Education  2020 [place] is the normal time of life before and after menstrual periods stop completely (menopause). Perimenopause can begin 2-8 years before menopause, and it usually lasts for 1 year after menopause. During perimenopause, the ovaries may or may not produce an egg. What are the causes? This condition is caused by a natural change in hormone levels that happens as you get older. What increases the risk? This condition is more likely to  start at an earlier age if you have certain medical conditions or treatments, including:  A tumor of the pituitary gland in the brain.  A disease that affects the ovaries and hormone production.  Radiation treatment for cancer.  Certain cancer treatments, such as chemotherapy or hormone (anti-estrogen) therapy.  Heavy smoking and excessive alcohol use.  Family history of early menopause. What are the signs or symptoms? Perimenopausal changes affect each woman differently. Symptoms of this condition may include:  Hot flashes.  Night sweats.  Irregular menstrual periods.  Decreased sex drive.  Vaginal dryness.  Headaches.  Mood swings.  Depression.  Memory problems or trouble concentrating.  Irritability.  Tiredness.  Weight gain.  Anxiety.  Trouble getting pregnant. How is this diagnosed? This condition is diagnosed based on your medical history, a physical exam, your age, your menstrual history, and your symptoms. Hormone tests may also be done. How is this treated? In some cases, no treatment is needed. You and your health care provider should make a decision together about whether treatment is necessary. Treatment will be based on your individual condition and preferences. Various treatments are available, such as:  Menopausal hormone therapy (MHT).  Medicines to treat specific symptoms.  Acupuncture.  Vitamin or herbal supplements. Before starting treatment, make sure to let your health care provider know if you have a personal or family history of:  Heart disease.  Breast cancer.  Blood clots.  Diabetes.  Osteoporosis. Follow these instructions at home: Lifestyle  Do not use any products that contain nicotine or tobacco, such as cigarettes and e-cigarettes. If you need help quitting, ask your health care provider.  Eat a balanced diet that includes fresh fruits and vegetables, whole grains, soybeans, eggs, lean meat, and low-fat dairy.  Get  at least 30 minutes of physical activity on 5 or more days each week.  Avoid alcoholic and caffeinated beverages, as well as spicy foods. This may help prevent hot flashes.  Get 7-8 hours of sleep each night.  Dress in layers that can be removed to help you manage hot flashes.  Find ways to manage stress, such as deep breathing, meditation, or journaling. General instructions  Keep track of your menstrual periods, including: ? When they occur. ? How heavy they are and how long they last. ? How much time passes between periods.  Keep track of your  symptoms, noting when they start, how often you have them, and how long they last.  Take over-the-counter and prescription medicines only as told by your health care provider.  Take vitamin supplements only as told by your health care provider. These may include calcium, vitamin E, and vitamin D.  Use vaginal lubricants or moisturizers to help with vaginal dryness and improve comfort during sex.  Talk with your health care provider before starting any herbal supplements.  Keep all follow-up visits as told by your health care provider. This is important. This includes any group therapy or counseling. Contact a health care provider if:  You have heavy vaginal bleeding or pass blood clots.  Your period lasts more than 2 days longer than normal.  Your periods are recurring sooner than 21 days.  You bleed after having sex. Get help right away if:  You have chest pain, trouble breathing, or trouble talking.  You have severe depression.  You have pain when you urinate.  You have severe headaches.  You have vision problems. Summary  Perimenopause is the time when a woman's body begins to move into menopause. This may happen naturally or as a result of other health problems or medical treatments.  Perimenopause can begin 2-8 years before menopause, and it usually lasts for 1 year after menopause.  Perimenopausal symptoms can be  managed through medicines, lifestyle changes, and complementary therapies such as acupuncture. This information is not intended to replace advice given to you by your health care provider. Make sure you discuss any questions you have with your health care provider. Document Released: 07/18/2004 Document Revised: 05/23/2017 Document Reviewed: 07/16/2016 Elsevier Patient Education  2020 [employer].

## 2019-04-21 LAB — COMPREHENSIVE METABOLIC PANEL
ALT: 10 IU/L (ref 0–32)
AST: 14 IU/L (ref 0–40)
Albumin/Globulin Ratio: 1.3 (ref 1.2–2.2)
Albumin: 4.2 g/dL (ref 3.8–4.8)
Alkaline Phosphatase: 63 IU/L (ref 39–117)
BUN/Creatinine Ratio: 10 (ref 9–23)
BUN: 10 mg/dL (ref 6–24)
Bilirubin Total: 0.8 mg/dL (ref 0.0–1.2)
CO2: 27 mmol/L (ref 20–29)
Calcium: 9.7 mg/dL (ref 8.7–10.2)
Chloride: 103 mmol/L (ref 96–106)
Creatinine, Ser: 0.98 mg/dL (ref 0.57–1.00)
GFR calc Af Amer: 79 mL/min/{1.73_m2} (ref >59)
GFR calc non Af Amer: 69 mL/min/{1.73_m2} (ref >59)
Globulin, Total: 3.2 g/dL (ref 1.5–4.5)
Glucose: 88 mg/dL (ref 65–99)
Potassium: 4.4 mmol/L (ref 3.5–5.2)
Sodium: 140 mmol/L (ref 134–144)
Total Protein: 7.4 g/dL (ref 6.0–8.5)

## 2019-04-21 LAB — LIPID PANEL
Chol/HDL Ratio: 4.3 ratio (ref 0.0–4.4)
Cholesterol, Total: 200 mg/dL — ABNORMAL HIGH (ref 100–199)
HDL: 47 mg/dL (ref >39)
LDL Chol Calc (NIH): 134 mg/dL — ABNORMAL HIGH (ref 0–99)
Triglycerides: 108 mg/dL (ref 0–149)
VLDL Cholesterol Cal: 19 mg/dL (ref 5–40)

## 2019-04-21 LAB — CBC
Hematocrit: 37.8 % (ref 34.0–46.6)
Hemoglobin: 12.4 g/dL (ref 11.1–15.9)
MCH: 30.2 pg (ref 26.6–33.0)
MCHC: 32.8 g/dL (ref 31.5–35.7)
MCV: 92 fL (ref 79–97)
Platelets: 255 10*3/uL (ref 150–450)
RBC: 4.11 x10E6/uL (ref 3.77–5.28)
RDW: 12.6 % (ref 11.7–15.4)
WBC: 6.2 10*3/uL (ref 3.4–10.8)

## 2019-04-21 LAB — FOLLICLE STIMULATING HORMONE: FSH: 5.4 m[IU]/mL

## 2019-04-21 LAB — PROLACTIN: Prolactin: 177 ng/mL — ABNORMAL HIGH (ref 4.8–23.3)

## 2019-04-21 LAB — VITAMIN D 25 HYDROXY (VIT D DEFICIENCY, FRACTURES): Vit D, 25-Hydroxy: 32.1 ng/mL (ref 30.0–100.0)

## 2019-04-21 LAB — TSH: TSH: 0.908 u[IU]/mL (ref 0.450–4.500)

## 2019-04-22 LAB — URINE CULTURE: Organism ID, Bacteria: NO GROWTH

## 2019-04-28 ENCOUNTER — Telehealth: Payer: Self-pay | Admitting: *Deleted

## 2019-04-28 DIAGNOSIS — R7989 Other specified abnormal findings of blood chemistry: Secondary | ICD-10-CM

## 2019-04-28 DIAGNOSIS — N912 Amenorrhea, unspecified: Secondary | ICD-10-CM

## 2019-04-28 NOTE — Telephone Encounter (Signed)
Spoke with patient, advised of all results as seen below per Melvia Heaps, CNM.   1. Patient agreeable to proceed with MRI brain w/wo contrast at [facility], order placed. Patient is aware she will be contacted directly by [facility] to schedule, then our office will precert. Placed in [place] hold.   2. Patient reports scant amount of nipple d/c from right breast for the past 3-6 months. Is unsure if there is any color, only notices on bra. Screening MMG negative 03/11/19. Denies lumps, skin changes or pain. Advised nipple d/c can sometime be associated with elevated prolatin level, will update Melvia Heaps, CNM and return call with recommendations. Patient verbalizes understanding and is agreeable.    Melvia Heaps, CNM -please advise on nipple d/c.  Cc: Dr. Sabra Heck,  Lerry Liner

## 2019-04-28 NOTE — Telephone Encounter (Signed)
It's likely due to the elevated prolactin.  If MMG was negative and there is not bloody nipple discharge, should proceed with this evaluation first because this will likely end up fixing the nipple discharge issue.  CC:  Melvia Heaps, CNM

## 2019-04-28 NOTE — Telephone Encounter (Signed)
Notes recorded by Burnice Logan, RN on 04/28/2019 at 9:11 AM EST  Left message to call Sharee Pimple, RN at [place].   ------   Notes recorded by Megan Salon, MD on 04/27/2019 at 5:34 PM EST  Pt needs MRI of brain with radiology knowing she has elevated prolactin level of 177 and we are looking for a prolactinoma. Thanks.  ------   Notes recorded by Regina Eck, CNM on 04/23/2019 at 12:56 PM EDT  Please let patient know her urine was negative for infection when called regarding her elevated Prolactin level.  ------   Notes recorded by Regina Eck, CNM on 04/23/2019 at 12:47 PM EDT  Notify patient her urine culture is negative. No infection noted.  ------   Notes recorded by Burnice Logan, RN on 04/22/2019 at 12:42 PM EDT  Forwarding to Dr. Sabra Heck to review prolactin level and advise on Brain MRI w/wo contrast.  ------   Notes recorded by Regina Eck, CNM on 04/22/2019 at 12:38 PM EDT  See previous note, she will need to be evaluated for Pituitary adenoma with MRI per UTD,please check with MD also  ------   Notes recorded by Regina Eck, CNM on 04/21/2019 at 3:20 PM EDT  Notify patient her [facility] is normal, does not indicate menopause  TSH is normal  Prolactin level is elevated at 177 Normal is up to 23.3 but she is on Thorazine and Verapamil which can cause elevation. Recommendation per UTD is still be evaluated for Pituitary adenoma due level above 100. Due to elevation this causes amenorrhea, so this is why no period at this point.  Lipid panel shows cholesterol at 200 borderline elevation  Triglycerides 108 normal < 149  HDL is normal at 47  LDL is borderline high at 134, normal is < 99 work on good diet of vegetables fiber, lean meat and fish  Liver, kidney and glucose profile is normal  CBC is normal , no anemia  Vitamin D is 32.1 which is normal  Urine culture is pending

## 2019-04-28 NOTE — Telephone Encounter (Signed)
Spoke with patient, advised per Dr. Sabra Heck. Patient denies bloody nipple d/c. Will proceed with imaging, is aware to contact office if any additional questions/concerns.   Encounter closed.

## 2019-04-28 NOTE — Telephone Encounter (Signed)
Patient returning call to [place].

## 2019-04-28 NOTE — Telephone Encounter (Signed)
Patient is returning call to [place], Therapist, sports.

## 2019-04-28 NOTE — Telephone Encounter (Signed)
Left message to call Sharee Pimple, RN at [place].

## 2019-05-23 ENCOUNTER — Other Ambulatory Visit: Payer: Self-pay

## 2019-05-23 ENCOUNTER — Ambulatory Visit
Admission: RE | Admit: 2019-05-23 | Discharge: 2019-05-23 | Disposition: A | Discharge status: Home / Self Care | Payer: PRIVATE HEALTH INSURANCE | Source: Ambulatory Visit | Attending: Obstetrics & Gynecology | Admitting: Obstetrics & Gynecology

## 2019-05-23 DIAGNOSIS — R7989 Other specified abnormal findings of blood chemistry: Secondary | ICD-10-CM

## 2019-05-23 DIAGNOSIS — N912 Amenorrhea, unspecified: Secondary | ICD-10-CM

## 2019-05-23 MED ORDER — GADOBENATE DIMEGLUMINE 529 MG/ML IV SOLN
7.0000 mL | Freq: Once | INTRAVENOUS | Status: AC | PRN
  Administered 2019-05-23: 7 mL via INTRAVENOUS

## 2019-05-24 ENCOUNTER — Telehealth: Payer: Self-pay | Admitting: *Deleted

## 2019-05-24 DIAGNOSIS — D352 Benign neoplasm of pituitary gland: Secondary | ICD-10-CM

## 2019-05-24 DIAGNOSIS — N912 Amenorrhea, unspecified: Secondary | ICD-10-CM

## 2019-05-24 DIAGNOSIS — R7989 Other specified abnormal findings of blood chemistry: Secondary | ICD-10-CM

## 2019-05-24 NOTE — Telephone Encounter (Signed)
Spoke with patient, advised of results as seen below per Dr. Sabra Heck. Patient agreeable to proceed with referral, options reviewed, order placed to [facility], Dr. Rita Ohara. Patient is aware she will be contacted with appt details once scheduled. Patient verbalizes understanding and is agreeable.   Routing to provider for final review. Patient is agreeable to disposition. Will close encounter.  Cc: Magdalene Patricia

## 2019-05-24 NOTE — Telephone Encounter (Addendum)
Notes recorded by Burnice Logan, RN on 05/24/2019 at 8:32 AM EST  Left message to call Sharee Pimple, RN at [place].   Removed from imaging hold.  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 She does not need surgery for this but does need to see neurosurgery. [facility]. Thanks.

## 2019-05-24 NOTE — Telephone Encounter (Signed)
Patient returned call

## 2019-05-28 ENCOUNTER — Telehealth: Payer: Self-pay | Admitting: Obstetrics & Gynecology

## 2019-05-28 NOTE — Telephone Encounter (Signed)
Patient calling to get an update on referral that was placed on 05/25/19. Patient is aware that Dr.Nudelman will review her records first before scheduling. I will follow up with Erin- Dr. Donnella Bi new patient coordinator next week for scheduling.

## 2019-09-10 ENCOUNTER — Encounter: Payer: Self-pay | Admitting: Certified Nurse Midwife

## 2019-10-18 ENCOUNTER — Telehealth: Payer: Self-pay | Admitting: *Deleted

## 2019-10-18 NOTE — Telephone Encounter (Signed)
Spoke with patient. Patient is requesting a referral to [facility] for fertility consult. Patient has hx of BTL in 2008. Wants to discuss possibility of reversal of BTL and fertility options.   Last AEX 04/20/19 with Melvia Heaps, CNM.  MyChart visit scheduled for 5/3 at 4:30pm with Dr. Sabra Heck. Patient is aware if any labs are needed, she may need a lab visit. Patient agreeable. Advised patient I will forward to Dr. Sabra Heck to review, our office will return call if any additional recommendations. Patient agreeable.   Routing to provider for final review. Patient is agreeable to disposition. Will close encounter.

## 2019-10-18 NOTE — Telephone Encounter (Signed)
Patient would like a referral to [facility] for infertility fertilization.

## 2019-10-25 ENCOUNTER — Telehealth (INDEPENDENT_AMBULATORY_CARE_PROVIDER_SITE_OTHER): Payer: PRIVATE HEALTH INSURANCE | Admitting: Obstetrics & Gynecology

## 2019-10-25 ENCOUNTER — Other Ambulatory Visit: Payer: Self-pay

## 2019-10-25 DIAGNOSIS — Z3141 Encounter for fertility testing: Secondary | ICD-10-CM | POA: Diagnosis not present

## 2019-10-25 DIAGNOSIS — Z319 Encounter for procreative management, unspecified: Secondary | ICD-10-CM

## 2019-10-25 DIAGNOSIS — Z9851 Tubal ligation status: Secondary | ICD-10-CM

## 2019-10-25 NOTE — Progress Notes (Signed)
Virtual Visit via Video Note  I connected with Courtney Valentine on 10/25/19 at  4:30 PM EDT by a video enabled telemedicine application and verified that I am speaking with the correct person using two identifiers.  Location: Patient: home Provider: office   I discussed the limitations of evaluation and management by telemedicine and the availability of in person appointments. The patient expressed understanding and agreed to proceed.  History of Present Illness: 49 yo MWF with h/o tubal ligation who is interested in pregnancy again.  Spouse has children from prior marriage as does she.  They would like to consider pregnancy together.  Due to age, she would like referral.    Cycles are regular and about 27 to 28 days apart.  She has not done any ovulation testing at this point.  D/w pt testing either day 3 FSH or AMH level to assess ovarian reserve.  Of course, I am happy to place referral.  Also, suggested semen analysis if they are going to see specialist.  Additional testing for ovulation discussed.  With age, may also need to consider donor egg.  This was discussed as well.  Questions welcomed and answered.     Observations/Objective: WNWD WF, NAD  Assessment and Plan: Desires pregnancy Would be AMA with pregnancy so fertility testing is appropriate.  AMH ordered placed. Information about semen analysis with kit labeled with pt's name and placed at the front.    Follow Up Instructions: I discussed the assessment and treatment plan with the patient. The patient was provided an opportunity to ask questions and all were answered. The patient agreed with the plan and demonstrated an understanding of the instructions.   The patient was advised to call back or seek an in-person evaluation if the symptoms worsen or if the condition fails to improve as anticipated.  I provided 25 minutes of non-face-to-face time during this encounter.   Megan Salon, MD

## 2019-10-26 ENCOUNTER — Other Ambulatory Visit: Payer: Self-pay

## 2019-10-26 ENCOUNTER — Other Ambulatory Visit (INDEPENDENT_AMBULATORY_CARE_PROVIDER_SITE_OTHER): Payer: PRIVATE HEALTH INSURANCE

## 2019-10-26 DIAGNOSIS — Z3141 Encounter for fertility testing: Secondary | ICD-10-CM

## 2019-10-27 ENCOUNTER — Encounter: Payer: Self-pay | Admitting: Obstetrics & Gynecology

## 2019-10-30 LAB — ANTI MULLERIAN HORMONE: ANTI-MULLERIAN HORMONE (AMH): 0.581 ng/mL

## 2019-11-01 ENCOUNTER — Encounter: Payer: Self-pay | Admitting: Obstetrics & Gynecology

## 2019-11-09 ENCOUNTER — Encounter: Payer: Self-pay | Admitting: Obstetrics & Gynecology

## 2019-11-09 ENCOUNTER — Telehealth: Payer: Self-pay

## 2019-11-09 NOTE — Telephone Encounter (Signed)
Pt sent mychart message.   Routing to Dr Sabra Heck to review and recommend.

## 2019-11-09 NOTE — Telephone Encounter (Signed)
Spoke to patient. Pt given recommendations per Dr Sabra Heck. Pt agreeable and verbalized understanding. Pt wants to thank Dr Sabra Heck for her referral and help.   Routing to Dr Sabra Heck for review.  Encounter closed.

## 2019-11-09 NOTE — Telephone Encounter (Signed)
Arturo, Freund[facility]  Phone Number: [phone]  Hi Dr Sabra Heck, [facility] I am excited but very scared of getting disappointing news. 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 I had a tubal ligation of the right side done in 2008. Is having a surgery to reverse the tubal something that is realistic if the doctor says that doing the IVF with a donor egg isn't something that he would recommend? I was thinking that even with the reversal surgery pregnancy would be difficult given my age and the result of the [facility] test. What are your thoughts?  Thank you so much,  Courtney Valentine

## 2019-11-09 NOTE — Telephone Encounter (Signed)
Using donor egg totally bypasses the fallopian tubes so that is not an issue.  Age of donor is what is most related to success so they do not use older women's eggs for donor eggs.  She is a candidate to use donor egg.  I've already discussed her with Dr. Kerin Perna.

## 2019-12-01 ENCOUNTER — Encounter: Payer: Self-pay | Admitting: Obstetrics & Gynecology

## 2019-12-01 ENCOUNTER — Telehealth: Payer: Self-pay

## 2019-12-01 NOTE — Telephone Encounter (Signed)
Pt sent following mychart message:   [name], [name] [facility]  Phone Number: [phone]  [facility] Dr Sabra Heck,   I am attaching the consent for my husband's upcoming semen analysis which is on June 16th. Please let me know if I need to do anything else.   Thank you!  Courtney Valentine

## 2019-12-01 NOTE — Telephone Encounter (Signed)
Placed semen analysis consent on Dr Ammie Ferrier desk   Routing to Dr Sabra Heck

## 2019-12-08 ENCOUNTER — Encounter: Payer: Self-pay | Admitting: Obstetrics & Gynecology

## 2019-12-16 ENCOUNTER — Encounter: Payer: Self-pay | Admitting: Obstetrics & Gynecology

## 2019-12-16 ENCOUNTER — Telehealth: Payer: Self-pay

## 2019-12-16 NOTE — Telephone Encounter (Signed)
Alphin, [name] Hi Dr Sabra Heck,  I was checking to see if you had received the results from my husband's, Courtney Valentine, semen analysis. He had the appointment on the 16th. Please direct me if I need to reach out elsewhere to receive his results.  Thank you,  Ardelia Mems

## 2019-12-16 NOTE — Telephone Encounter (Signed)
Pt sent mychart message asking if we had received semen anaylsis report from 12/08/19.  Routing to Dr Sabra Heck, please advise.

## 2019-12-18 NOTE — Telephone Encounter (Signed)
Please confirm release of records consent has been signed.  Husband's sperm has normal motility and concentration.  The number of normal sperm (morphology) is decreased.  He should try very hard to stop using any marijuana and start a vitamin regimen of Vit C 1074m, Vit E 4333LK folic acid 4562BWL and zinc 550meach daily.  Should consider repeat semen analysis in 3 months.  Pt should let me know in a month how successful he is with vitamin regimen and cessation of smoking.  Then I can place order for repeat semen analysis.

## 2019-12-20 NOTE — Telephone Encounter (Signed)
Spoke with pt. Pt given results and recommendations per Dr Sabra Heck. Pt agreeable and verbalized understanding. Pt states has appt with Dr Kerin Perna on 12/22/19. Pt advised to keep appt and can start vitamin regimen that Dr Sabra Heck suggests as well. Pt verbalized understanding. Pt ok with repeating semen analysis.   Routing to Dr Sabra Heck for review. Encounter closed.

## 2020-01-10 ENCOUNTER — Ambulatory Visit: Payer: PRIVATE HEALTH INSURANCE

## 2020-01-10 ENCOUNTER — Other Ambulatory Visit: Payer: Self-pay

## 2020-01-10 DIAGNOSIS — R0609 Other forms of dyspnea: Secondary | ICD-10-CM

## 2020-01-11 ENCOUNTER — Encounter: Payer: Self-pay | Admitting: Cardiology

## 2020-01-11 ENCOUNTER — Ambulatory Visit: Payer: PRIVATE HEALTH INSURANCE | Admitting: Cardiology

## 2020-01-11 VITALS — BP 114/81 | HR 66 | Resp 15 | Ht 65.0 in | Wt 132.0 lb

## 2020-01-11 DIAGNOSIS — E782 Mixed hyperlipidemia: Secondary | ICD-10-CM | POA: Insufficient documentation

## 2020-01-11 DIAGNOSIS — R0609 Other forms of dyspnea: Secondary | ICD-10-CM | POA: Insufficient documentation

## 2020-01-11 DIAGNOSIS — R9439 Abnormal result of other cardiovascular function study: Secondary | ICD-10-CM | POA: Insufficient documentation

## 2020-01-11 NOTE — Progress Notes (Signed)
Follow up visit  Subjective:   Courtney Valentine, female    DOB: 08/12/1970, 49 y.o.   MRN: [id number]   HPI  Chief Complaint  Patient presents with  . Follow-up    49 year old Caucasian female with hypertension, psoriatic arthritis, migraine, bipolar disorder, with exertional dyspnea and abnormal exercise treadmill stress test.  Patient was seen through telemedicine visit in April 2020 for complaints of decreased exercise tolerance and exertional dyspnea.  She underwent echocardiogram at that time that showed structurally normal heart, mild MR and TR, normal pulmonary hypertension.  However, she will be underwent exercise treadmill stress test yesterday on 01/10/2020.  Patient exercised for 9.3 METS, did complain exertional dyspnea and lightheadedness.  She had a clear liquid ischemic changes on stress EKG, details below.   Patient lives with her daughter and stepdaughter, works from home with a sedentary job.  She walks to the mailbox and back, 1 mile distance, 3-4 times a week.  She does endorse exertional dyspnea, especially walking up the hill.  But this has improved compared to a year ago, it still persist.  While on exercise treadmill stress test, she reported exertional dyspnea and lightheadedness.  She denies any chest pain.  Current Outpatient Medications on File Prior to Visit  Medication Sig Dispense Refill  . almotriptan (AXERT) 12.5 MG tablet as needed.   4  . amphetamine-dextroamphetamine (ADDERALL XR) 30 MG 24 hr capsule Take by mouth daily.     Marland Kitchen atenolol (TENORMIN) 25 MG tablet Take by mouth daily.    . baclofen (LIORESAL) 10 MG tablet Take 10 mg by mouth 3 (three) times daily as needed.    . Botulinum Toxin Type A (BOTOX) 200 units SOLR Every 3 months    . buPROPion (WELLBUTRIN XL) 150 MG 24 hr tablet Take 150 mg by mouth every morning.    . cabergoline (DOSTINEX) 0.5 MG tablet Take 0.25 mg by mouth 2 (two) times a week.    . chlorproMAZINE (THORAZINE) 25 MG tablet  Take by mouth.    . clonazePAM (KLONOPIN) 1 MG tablet 1 mg daily. 2 at hs  3  . inFLIXimab (REMICADE) 100 MG injection Every 8 weeks    . ketorolac (TORADOL) 10 MG tablet TAKE 1 TABLET(10 MG) BY MOUTH EVERY 6 HOURS AS NEEDED FOR PAIN    . promethazine (PHENERGAN) 25 MG tablet     . traMADol (ULTRAM) 50 MG tablet Take 50 mg by mouth every 6 (six) hours as needed.    . traZODone (DESYREL) 50 MG tablet Take 50 mg by mouth at bedtime.    . gabapentin (NEURONTIN) 300 MG capsule Take  3 pills QHS (Patient not taking: Reported on 01/11/2020)    . hydrOXYzine (ATARAX/VISTARIL) 10 MG tablet Take 10 mg by mouth 3 (three) times daily.    Marland Kitchen LATUDA 40 MG TABS tablet daily.  (Patient not taking: Reported on 01/11/2020)  1  . OLANZapine-FLUoxetine (SYMBYAX) 3-25 MG capsule Take 1 capsule by mouth at bedtime.    . Suvorexant (BELSOMRA) 20 MG TABS Take by mouth. (Patient not taking: Reported on 01/11/2020)    . verapamil (CALAN) 40 MG tablet Take 3 pills BID (Patient not taking: Reported on 01/11/2020)    . zaleplon (SONATA) 10 MG capsule Take by mouth. (Patient not taking: Reported on 01/11/2020)     No current facility-administered medications on file prior to visit.    Cardiovascular & other pertient studies:  Exercise treadmill stress test 01/10/2020: The patient exercised for  7 minutes and 30 seconds on Bruce protocol; achieved 9.34 METs at 84% of maximum predicted heart rate.  Chest pain is not present. Exercise treadmill stress test performed using Bruce protocol.  Patient reached 9.3 METS, and 84% of age predicted maximum heart rate.  Exercise capacity was good. No chest pain reported, however exertional dyspnea and lightheadedness reported. Normal heart rate and hemodynamic response.  Stress ECG demonstrated sinus tachycardia, 1-1.5 mm upsloping ST depressions in leads V4-V6, T wave inversion in leads II, III, aVF. Changes resolved 2 min into recovery. Intermediate risk study. Recommend clinical  correlation.   Echocardiogram 11/03/2018:  Left ventricle cavity is normal in size. Mild concentric hypertrophy of  the left ventricle. Normal global wall motion. Normal diastolic filling  pattern. Calculated EF 65%.  Mild (Grade I) mitral regurgitation.  Mild tricuspid regurgitation.  No evidence of pulmonary hypertension.   Recent labs: 09/16/2019: BUN/Cr 13/0.9. eGFR 72.  06/2019: Glucose 98  04/20/2019: Glucose 88, BUN/Cr 10/0.98. EGFR 69. Na/K 140/4.4. Rest of the CMP normal H/H 12/37. MCV 92. Platelets 255 Chol 200, TG 108, HDL 47, LDL 134 TSH 0.9normal    Review of Systems  Cardiovascular: Positive for dyspnea on exertion. Negative for chest pain, leg swelling, palpitations and syncope.         Vitals:   01/11/20 1130  BP: 114/81  Pulse: 66  Resp: 15  SpO2: 99%     Body mass index is 21.97 kg/m. Filed Weights   01/11/20 1130  Weight: 132 lb (59.9 kg)     Objective:   Physical Exam Vitals and nursing note reviewed.  Constitutional:      General: She is not in acute distress. Neck:     Vascular: No JVD.  Cardiovascular:     Rate and Rhythm: Normal rate and regular rhythm.     Heart sounds: Normal heart sounds. No murmur heard.   Pulmonary:     Effort: Pulmonary effort is normal.     Breath sounds: Normal breath sounds. No wheezing or rales.           Assessment & Recommendations:   49 year old Caucasian female with hypertension, psoriatic arthritis, migraine, bipolar disorder, with exertional dyspnea and abnormal exercise treadmill stress test.  Risk factors for CAD include hyperlipidemia.  Exertional dyspnea, which could be anginal equivalent, exercise treadmill stress test abnormality, recommend coronary CT angiogram for coronary anatomy evaluation.  Based on findings, will make further recommendations regarding lipid management.  Patient knows to take her home medication atenolol 5 mg 2 tablets 2 hours before the CT  scan.  Recommendations after above testing.      Nigel Mormon, MD [facility] Pager: [phone] Office: [phone]

## 2020-01-11 NOTE — Patient Instructions (Signed)
Your cardiac CT will be scheduled at the locations below:   [facility]  [address]  [place], [place] [address]  [phone]    If scheduled at [facility], please arrive at the [facility] main entrance of [facility] 30-45 minutes prior to test start time.  Proceed to the [facility] Radiology Department (first floor) to check-in and test prep.   Please follow these instructions carefully (unless otherwise directed):    On the Night Before the Test:   Be sure to Drink plenty of water.   Do not consume any caffeinated/decaffeinated beverages or chocolate 12 hours prior to your test.   Do not take any antihistamines 12 hours prior to your test.   If the patient has contrast allergy:  Patient will need a prescription for Prednisone and very clear instructions (as follows):  1. Prednisone 50 mg - take 13 hours prior to test  2. Take another Prednisone 50 mg 7 hours prior to test  3. Take another Prednisone 50 mg 1 hour prior to test  4. Take Benadryl 50 mg 1 hour prior to test   Patient must complete all four doses of above prophylactic medications.   Patient will need a ride after test due to Benadryl.   On the Day of the Test:   Drink plenty of water. Do not drink any water within one hour of the test.   Do not eat any food 4 hours prior to the test.   You may take your regular medications prior to the test.   Take your atenolol 25 mg 2 tablets 2 hours before CT scan    FEMALES- please wear underwire-free bra if available          After the Test:   Drink plenty of water.   After receiving IV contrast, you may experience a mild flushed feeling. This is normal.   On occasion, you may experience a mild rash up to 24 hours after the test. This is not dangerous. If this occurs, you can take Benadryl 25 mg and increase your fluid intake.   If you experience trouble breathing, this can be serious. If it is severe call 911  IMMEDIATELY. If it is mild, please call our office.   If you take any of these medications: Glipizide/Metformin, Avandament, Glucavance, please do not take 48 hours after completing test unless otherwise instructed.     Please contact the cardiac imaging nurse navigator should you have any questions/concerns  Marchia Bond, RN Navigator Cardiac Imaging  [facility]  [phone] Office  [phone] Cell

## 2020-02-02 ENCOUNTER — Telehealth (HOSPITAL_COMMUNITY): Payer: Self-pay | Admitting: Emergency Medicine

## 2020-02-02 NOTE — Telephone Encounter (Signed)
Reaching out to patient to offer assistance regarding upcoming cardiac imaging study; pt verbalizes understanding of appt date/time, parking situation and where to check in, pre-test NPO status and medications ordered, and verified current allergies; name and call back number provided for further questions should they arise Courtney Bond RN Navigator Cardiac Imaging [facility] [phone] office [phone] cell  Holding adderall prior to scan

## 2020-02-04 ENCOUNTER — Other Ambulatory Visit: Payer: Self-pay

## 2020-02-04 ENCOUNTER — Ambulatory Visit (HOSPITAL_COMMUNITY)
Admission: RE | Admit: 2020-02-04 | Discharge: 2020-02-04 | Disposition: A | Discharge status: Home / Self Care | Payer: PRIVATE HEALTH INSURANCE | Source: Ambulatory Visit | Attending: Cardiology | Admitting: Cardiology

## 2020-02-04 DIAGNOSIS — R9430 Abnormal result of cardiovascular function study, unspecified: Secondary | ICD-10-CM

## 2020-02-04 DIAGNOSIS — R0600 Dyspnea, unspecified: Secondary | ICD-10-CM | POA: Insufficient documentation

## 2020-02-04 DIAGNOSIS — R0609 Other forms of dyspnea: Secondary | ICD-10-CM

## 2020-02-04 DIAGNOSIS — R9439 Abnormal result of other cardiovascular function study: Secondary | ICD-10-CM | POA: Diagnosis present

## 2020-02-04 MED ORDER — NITROGLYCERIN 0.4 MG SL SUBL
SUBLINGUAL_TABLET | SUBLINGUAL | Status: AC
  Filled 2020-02-04: qty 1

## 2020-02-04 MED ORDER — METOPROLOL TARTRATE 5 MG/5ML IV SOLN
INTRAVENOUS | Status: AC
  Filled 2020-02-04: qty 5

## 2020-02-04 MED ORDER — IOHEXOL 350 MG/ML SOLN
75.0000 mL | Freq: Once | INTRAVENOUS | Status: AC | PRN
  Administered 2020-02-04: 75 mL via INTRAVENOUS

## 2020-02-04 MED ORDER — NITROGLYCERIN 0.4 MG SL SUBL
0.4000 mg | SUBLINGUAL_TABLET | Freq: Once | SUBLINGUAL | Status: AC
  Administered 2020-02-04: 0.4 mg via SUBLINGUAL

## 2020-02-10 NOTE — Progress Notes (Signed)
Hi Sara,  I do not see the coronary CTA report on this patient. Study was performed on 8/13. Can you follow up please?  Thanks MJP

## 2020-02-14 ENCOUNTER — Other Ambulatory Visit: Payer: Self-pay | Admitting: Cardiology

## 2020-02-14 DIAGNOSIS — E782 Mixed hyperlipidemia: Secondary | ICD-10-CM

## 2020-02-14 MED ORDER — ROSUVASTATIN CALCIUM 10 MG PO TABS: 10.0000 mg | ORAL_TABLET | Freq: Every day | ORAL | 3 refills | Status: DC

## 2020-02-14 NOTE — Progress Notes (Signed)
Discussed results with the patient.  Recommend starting crestor 10 mg daily. Repeat lipid panel in 3 months, then office f/u

## 2020-03-21 ENCOUNTER — Telehealth: Payer: Self-pay

## 2020-03-21 NOTE — Telephone Encounter (Signed)
Patient states she is having problems with hormones and irregular periods.

## 2020-03-21 NOTE — Telephone Encounter (Signed)
AEX 03/2019, next not scheduled  BTL Migraines, managed by [facility]  Spoke with pt. Pt states having increased PMS symptoms x 1 week prior to cycle for the last 4 months. Pt states having irritability, increased moodiness and headaches that go to migraines.  Pt states cycles have also been heavier, changing a super tampon every 3 hours for 3-4 days of 5 day period. Denies clots, feeling weak, dizzy or lightheaded.   Pt asking if she can restart birth control to help regulate cycles and PMS symptoms.  Advised will review with Dr Sabra Heck and will return call. Pt agreeable.   Routing to Dr Sabra Heck.

## 2020-03-21 NOTE — Telephone Encounter (Signed)
Yes it is ok to restart OCPs.  She was on micronor.  Ok to start now and send in rx with RFs for her.  Thanks.

## 2020-03-22 MED ORDER — NORETHINDRONE 0.35 MG PO TABS: 1.0000 | ORAL_TABLET | Freq: Every day | ORAL | 0 refills | Status: DC

## 2020-03-22 NOTE — Telephone Encounter (Signed)
Spoke with pt. Pt given update and recommendations per Dr Sabra Heck. Pt agreeable to use POP again. Pt advised to take every day and not skip or miss pills and pt to start taking today. Pt agreeable and verbalized understanding. Pt only wants 90 day supply and will give update at AEX on 11/2 with Dr Sabra Heck for more refills.  Rx Micronor # [id number], 0RF  Routing to Dr Sabra Heck for update Encounter closed.

## 2020-03-22 NOTE — Telephone Encounter (Signed)
Left message for pt to return call to triage RN.

## 2020-04-24 NOTE — Progress Notes (Signed)
49 y.o. [id number] Married White or Caucasian female here for annual exam.  She is remarried x 2 years.  She and husband did do the evaluation with Dr. Kerin Perna and got to the place where they were looking at donor eggs but decided to put this on the back burner for the time being.    On micronor for help with cycles.  H/o micropituitary adenoma.  This is followed by Dr. Buddy Duty, endocrinology.  Once started cabergoline, cycles became more regular but they've become heavier and with increased cramping.  Having prolactin levels tested every 6 months.  She's having more mood changes and worsening migraines.  Started micronor to help with all of this.  Has finished one pack and this made symptoms about 50% better.    Patient's last menstrual period was 04/08/2020.          Sexually active: Yes.    The current method of family planning is Micronor.    Exercising: Yes.    cardio Smoker:  no  Health Maintenance: Pap:  07/15/17 Neg:Neg HR HPV  03/27/16 Neg:Neg HR HPV History of abnormal Pap:  no MMG:  03/11/19 BIRADS 1 negative/density b Colonoscopy:  n/a BMD:   n/a TDaP:  03/27/16 Pneumonia vaccine(s):  n/a Shingrix:   n/a Hep C testing: n/a Screening Labs: full panel done 04/20/2019.     reports that she has never smoked. She has never used smokeless tobacco. She reports that she does not drink alcohol and does not use drugs.  Past Medical History:  Diagnosis Date  . Anxiety   . Bipolar disorder ([place])   . COVID-19   . Depression   . Hypertension   . Migraines   . Migraines   . Psoriatic arthritis [facility])     Past Surgical History:  Procedure Laterality Date  . AUGMENTATION MAMMAPLASTY Bilateral   . CESAREAN SECTION    . DILATION AND CURETTAGE OF UTERUS    . SALPINGECTOMY     left side  . TUBAL LIGATION      Current Outpatient Medications  Medication Sig Dispense Refill  . almotriptan (AXERT) 12.5 MG tablet as needed.   4  . amphetamine-dextroamphetamine (ADDERALL XR) 30 MG 24 hr  capsule Take by mouth daily.     Marland Kitchen atenolol (TENORMIN) 25 MG tablet Take by mouth daily.    . baclofen (LIORESAL) 10 MG tablet Take 10 mg by mouth 3 (three) times daily as needed.    . Botulinum Toxin Type A (BOTOX) 200 units SOLR Every 3 months    . buPROPion (WELLBUTRIN XL) 150 MG 24 hr tablet Take 150 mg by mouth every morning.    . cabergoline (DOSTINEX) 0.5 MG tablet Take 0.25 mg by mouth 2 (two) times a week.    . chlorproMAZINE (THORAZINE) 25 MG tablet Take by mouth.    . clonazePAM (KLONOPIN) 1 MG tablet 1 mg daily. 2 at hs  3  . hydrOXYzine (ATARAX/VISTARIL) 10 MG tablet Take 10 mg by mouth 3 (three) times daily.    Marland Kitchen inFLIXimab (REMICADE) 100 MG injection Every 8 weeks    . ketorolac (TORADOL) 10 MG tablet TAKE 1 TABLET(10 MG) BY MOUTH EVERY 6 HOURS AS NEEDED FOR PAIN    . norethindrone (MICRONOR) 0.35 MG tablet Take 1 tablet (0.35 mg total) by mouth daily. 84 tablet 0  . OLANZapine-FLUoxetine (SYMBYAX) 3-25 MG capsule Take 1 capsule by mouth at bedtime.    . promethazine (PHENERGAN) 25 MG tablet     .  traZODone (DESYREL) 50 MG tablet Take 50 mg by mouth at bedtime.     No current facility-administered medications for this visit.    Family History  Problem Relation Age of Onset  . Diabetes Mother   . Hypertension Mother   . Diabetes Father   . Hypertension Father   . Alzheimer's disease Father   . Heart disease Maternal Grandfather   . Heart attack Maternal Grandfather   . Lung cancer Paternal Grandfather     Review of Systems  All other systems reviewed and are negative.   Exam:   BP 108/62 (BP Location: Right Arm, Patient Position: Sitting, Cuff Size: Normal)   Pulse 64   Resp 16   Ht 5' 5.5" (1.664 m)   Wt 145 lb (65.8 kg)   LMP 04/08/2020   BMI 23.76 kg/m   Height: 5' 5.5" (166.4 cm)  General appearance: alert, cooperative and appears stated age Head: Normocephalic, without obvious abnormality, atraumatic Neck: no adenopathy, supple, symmetrical, trachea  midline and thyroid normal to inspection and palpation Lungs: clear to auscultation bilaterally Breasts: normal appearance, no masses or tenderness Heart: regular rate and rhythm Abdomen: soft, non-tender; bowel sounds normal; no masses,  no organomegaly Extremities: extremities normal, atraumatic, no cyanosis or edema Skin: Skin color, texture, turgor normal. No rashes or lesions Lymph nodes: Cervical, supraclavicular, and axillary nodes normal. No abnormal inguinal nodes palpated Neurologic: Grossly normal   Pelvic: External genitalia:  no lesions              Urethra:  normal appearing urethra with no masses, tenderness or lesions              Bartholins and Skenes: normal                 Vagina: normal appearing vagina with normal color and discharge, no lesions              Cervix: no lesions              Pap taken: Yes.   Bimanual Exam:  Uterus:  normal size, contour, position, consistency, mobility, non-tender              Adnexa: normal adnexa and no mass, fullness, tenderness               Rectovaginal: Confirms               Anus:  normal sphincter tone, no lesions  Chaperone, Karmen Bongo, CMA, was present for exam.  A:  Well Woman with normal exam Pituitary microadenoma H/o BTL, has considered donor egg for pregnancy and saw Dr. Kerin Perna H/o depression Migraines with aura Psoriatic arthritis  P:   Mammogram guidelines reviewed.  Pt aware this is due. pap smear with HR HPV obtained today Lab work done last year.  None obtained today. Prolactin levels followed by Dr. Buddy Duty Cologuard ordered. Vaccines reviewed Pt is going to wait another cycle before deciding if wants to stay on micronor.  Given migraines hx we discussed not using estrogen containing methods.   Return annually or prn

## 2020-04-25 ENCOUNTER — Encounter: Payer: Self-pay | Admitting: Obstetrics & Gynecology

## 2020-04-25 ENCOUNTER — Other Ambulatory Visit (HOSPITAL_COMMUNITY)
Admission: RE | Admit: 2020-04-25 | Discharge: 2020-04-25 | Disposition: A | Discharge status: Home / Self Care | Payer: PRIVATE HEALTH INSURANCE | Source: Ambulatory Visit | Attending: Obstetrics & Gynecology | Admitting: Obstetrics & Gynecology

## 2020-04-25 ENCOUNTER — Other Ambulatory Visit: Payer: Self-pay

## 2020-04-25 ENCOUNTER — Ambulatory Visit (INDEPENDENT_AMBULATORY_CARE_PROVIDER_SITE_OTHER): Payer: PRIVATE HEALTH INSURANCE | Admitting: Obstetrics & Gynecology

## 2020-04-25 VITALS — BP 108/62 | HR 64 | Resp 16 | Ht 65.5 in | Wt 145.0 lb

## 2020-04-25 DIAGNOSIS — Z01419 Encounter for gynecological examination (general) (routine) without abnormal findings: Secondary | ICD-10-CM | POA: Diagnosis not present

## 2020-04-25 DIAGNOSIS — Z124 Encounter for screening for malignant neoplasm of cervix: Secondary | ICD-10-CM | POA: Insufficient documentation

## 2020-04-26 LAB — CYTOLOGY - PAP
Comment: NEGATIVE
Diagnosis: NEGATIVE
High risk HPV: NEGATIVE

## 2020-05-17 ENCOUNTER — Ambulatory Visit: Payer: PRIVATE HEALTH INSURANCE | Admitting: Cardiology

## 2020-06-09 LAB — COLOGUARD: COLOGUARD: NEGATIVE

## 2020-06-19 ENCOUNTER — Telehealth: Payer: Self-pay | Admitting: Obstetrics and Gynecology

## 2020-06-19 NOTE — Telephone Encounter (Signed)
Please let patient know that her Cologuard returned negative.

## 2020-06-20 ENCOUNTER — Other Ambulatory Visit: Payer: Self-pay

## 2020-06-20 MED ORDER — NORETHINDRONE 0.35 MG PO TABS: 1.0000 | ORAL_TABLET | Freq: Every day | ORAL | 1 refills | Status: DC

## 2020-06-20 NOTE — Telephone Encounter (Signed)
Tried calling patient regarding MMG. No answer, left message for patient to call me back.

## 2020-06-20 NOTE — Telephone Encounter (Signed)
Patient returned call and states that she has not had an updated MMG but will call to schedule. Advised patient that refill was sent for 2 months. Patient verbalizes understanding and is agreeable. Closing encounter.

## 2020-06-20 NOTE — Telephone Encounter (Signed)
Please have patient update mammogram if not already done.   I will refill her pills for 2 months to give her time to do this.

## 2020-06-20 NOTE — Telephone Encounter (Signed)
Medication refill request: Micronor Last AEX:  04/25/20 Dr. Sabra Heck Next AEX: none Last MMG (if hormonal medication request): 03/11/19 BIRADS 1 negative/density b Refill authorized: today, please advise

## 2020-07-17 NOTE — Telephone Encounter (Signed)
Called patient and per DPR, left message stating cologuard negative, call if any questions.

## 2020-07-18 ENCOUNTER — Other Ambulatory Visit: Payer: Self-pay | Admitting: Obstetrics & Gynecology

## 2020-07-18 DIAGNOSIS — Z1231 Encounter for screening mammogram for malignant neoplasm of breast: Secondary | ICD-10-CM

## 2020-08-23 ENCOUNTER — Encounter (HOSPITAL_BASED_OUTPATIENT_CLINIC_OR_DEPARTMENT_OTHER): Payer: Self-pay

## 2020-08-23 ENCOUNTER — Other Ambulatory Visit: Payer: Self-pay | Admitting: Obstetrics and Gynecology

## 2020-08-29 ENCOUNTER — Other Ambulatory Visit (HOSPITAL_BASED_OUTPATIENT_CLINIC_OR_DEPARTMENT_OTHER): Payer: Self-pay | Admitting: Obstetrics & Gynecology

## 2020-08-29 MED ORDER — NORETHINDRONE 0.35 MG PO TABS: 1.0000 | ORAL_TABLET | Freq: Every day | ORAL | 3 refills | Status: DC

## 2020-08-30 ENCOUNTER — Ambulatory Visit: Payer: PRIVATE HEALTH INSURANCE

## 2020-10-19 ENCOUNTER — Ambulatory Visit: Payer: PRIVATE HEALTH INSURANCE

## 2020-12-06 ENCOUNTER — Other Ambulatory Visit: Payer: Self-pay

## 2020-12-06 ENCOUNTER — Ambulatory Visit
Admission: RE | Admit: 2020-12-06 | Discharge: 2020-12-06 | Disposition: A | Discharge status: Home / Self Care | Payer: PRIVATE HEALTH INSURANCE | Source: Ambulatory Visit | Attending: Obstetrics & Gynecology | Admitting: Obstetrics & Gynecology

## 2020-12-06 DIAGNOSIS — Z1231 Encounter for screening mammogram for malignant neoplasm of breast: Secondary | ICD-10-CM

## 2020-12-13 ENCOUNTER — Other Ambulatory Visit (HOSPITAL_BASED_OUTPATIENT_CLINIC_OR_DEPARTMENT_OTHER): Payer: Self-pay | Admitting: Obstetrics & Gynecology

## 2021-03-24 HISTORY — PX: MICRODISCECTOMY LUMBAR: SUR864

## 2021-04-05 ENCOUNTER — Other Ambulatory Visit (HOSPITAL_BASED_OUTPATIENT_CLINIC_OR_DEPARTMENT_OTHER): Payer: Self-pay | Admitting: *Deleted

## 2021-04-05 MED ORDER — NORETHINDRONE 0.35 MG PO TABS: ORAL_TABLET | ORAL | 0 refills | Status: DC

## 2021-04-05 NOTE — Progress Notes (Signed)
Fax received from pharmacy for refill. Refill sent electronically

## 2021-04-11 ENCOUNTER — Ambulatory Visit
Admission: RE | Admit: 2021-04-11 | Discharge: 2021-04-11 | Disposition: A | Discharge status: Home / Self Care | Payer: No Typology Code available for payment source | Source: Ambulatory Visit | Attending: Neurosurgery | Admitting: Neurosurgery

## 2021-04-11 ENCOUNTER — Other Ambulatory Visit: Payer: Self-pay | Admitting: Neurosurgery

## 2021-04-11 ENCOUNTER — Other Ambulatory Visit: Payer: Self-pay

## 2021-04-11 DIAGNOSIS — M5126 Other intervertebral disc displacement, lumbar region: Secondary | ICD-10-CM

## 2021-04-11 MED ORDER — GADOBENATE DIMEGLUMINE 529 MG/ML IV SOLN
13.0000 mL | Freq: Once | INTRAVENOUS | Status: AC | PRN
  Administered 2021-04-11: 13 mL via INTRAVENOUS

## 2021-04-26 ENCOUNTER — Encounter (HOSPITAL_BASED_OUTPATIENT_CLINIC_OR_DEPARTMENT_OTHER): Payer: Self-pay

## 2021-04-26 ENCOUNTER — Ambulatory Visit (HOSPITAL_BASED_OUTPATIENT_CLINIC_OR_DEPARTMENT_OTHER): Payer: No Typology Code available for payment source | Admitting: Obstetrics & Gynecology

## 2021-05-01 ENCOUNTER — Other Ambulatory Visit: Payer: Self-pay | Admitting: Internal Medicine

## 2021-05-01 DIAGNOSIS — E221 Hyperprolactinemia: Secondary | ICD-10-CM

## 2021-05-01 DIAGNOSIS — D352 Benign neoplasm of pituitary gland: Secondary | ICD-10-CM

## 2021-05-03 ENCOUNTER — Other Ambulatory Visit (HOSPITAL_BASED_OUTPATIENT_CLINIC_OR_DEPARTMENT_OTHER): Payer: Self-pay | Admitting: Obstetrics & Gynecology

## 2021-07-02 ENCOUNTER — Other Ambulatory Visit (HOSPITAL_BASED_OUTPATIENT_CLINIC_OR_DEPARTMENT_OTHER): Payer: Self-pay | Admitting: *Deleted

## 2021-07-02 MED ORDER — NORETHINDRONE 0.35 MG PO TABS: 1.0000 | ORAL_TABLET | Freq: Every day | ORAL | 0 refills | Status: DC

## 2021-07-11 ENCOUNTER — Ambulatory Visit (INDEPENDENT_AMBULATORY_CARE_PROVIDER_SITE_OTHER): Payer: PRIVATE HEALTH INSURANCE | Admitting: Obstetrics & Gynecology

## 2021-07-11 ENCOUNTER — Other Ambulatory Visit: Payer: Self-pay

## 2021-07-11 ENCOUNTER — Encounter (HOSPITAL_BASED_OUTPATIENT_CLINIC_OR_DEPARTMENT_OTHER): Payer: Self-pay | Admitting: Obstetrics & Gynecology

## 2021-07-11 VITALS — BP 126/76 | HR 89 | Ht 65.0 in | Wt 116.8 lb

## 2021-07-11 DIAGNOSIS — D352 Benign neoplasm of pituitary gland: Secondary | ICD-10-CM

## 2021-07-11 DIAGNOSIS — L4050 Arthropathic psoriasis, unspecified: Secondary | ICD-10-CM | POA: Diagnosis not present

## 2021-07-11 DIAGNOSIS — N926 Irregular menstruation, unspecified: Secondary | ICD-10-CM

## 2021-07-11 DIAGNOSIS — Z8669 Personal history of other diseases of the nervous system and sense organs: Secondary | ICD-10-CM | POA: Diagnosis not present

## 2021-07-11 DIAGNOSIS — F1190 Opioid use, unspecified, uncomplicated: Secondary | ICD-10-CM

## 2021-07-11 DIAGNOSIS — Z01419 Encounter for gynecological examination (general) (routine) without abnormal findings: Secondary | ICD-10-CM | POA: Diagnosis not present

## 2021-07-11 MED ORDER — NORETHINDRONE 0.35 MG PO TABS: 1.0000 | ORAL_TABLET | Freq: Every day | ORAL | 4 refills | Status: DC

## 2021-07-11 NOTE — Progress Notes (Signed)
51 y.o. [id number] Married White or Caucasian female here for annual exam.  Holidays were hard.  Father passed away in early 07-16-2023.  Step daughter's grandmother (mother's mother) passed away in Jul 16, 2023 as well.  She's had a lot of stressors.    Had back surgery in October.  Has had some issues with stopping pain medication.  Is off post op pain medication now.  On suboxone now.  Cycles are under good control with micronor.  Does need RF.  Patient's last menstrual period was 07/03/2021.          Sexually active: Yes.    The current method of family planning is oral progesterone-only contraceptive.    Exercising: Yes.     Smoker:  no  Health Maintenance: Pap:  04/25/20 neg, neg HR HPV History of abnormal Pap:  no MMG:  12/06/20 neg Colonoscopy:  cologuard completed BMD:   not indicated Screening Labs: Does with Dr. Amil Amen.  Is seen every 6 months.   reports that she has never smoked. She has never used smokeless tobacco. She reports that she does not drink alcohol and does not use drugs.  Past Medical History:  Diagnosis Date   Anxiety    Bipolar disorder ([place])    COVID-19    Depression    Hypertension    Migraine with aura    Psoriatic arthritis ([place])     Past Surgical History:  Procedure Laterality Date   AUGMENTATION MAMMAPLASTY Bilateral    CESAREAN SECTION     DILATION AND CURETTAGE OF UTERUS     MICRODISCECTOMY LUMBAR  03/2021   SALPINGECTOMY     left side   TUBAL LIGATION      Current Outpatient Medications  Medication Sig Dispense Refill   amphetamine-dextroamphetamine (ADDERALL XR) 30 MG 24 hr capsule Take 15 mg by mouth daily.     atenolol (TENORMIN) 25 MG tablet Take by mouth daily.     Botulinum Toxin Type A 200 units SOLR Every 3 months     buprenorphine-naloxone (SUBOXONE) 2-0.5 mg SUBL SL tablet Place 1 tablet under the tongue daily.     cabergoline (DOSTINEX) 0.5 MG tablet Take 0.25 mg by mouth 2 (two) times a week.     CAPLYTA 42 MG capsule Take 42 mg  by mouth at bedtime.     diclofenac (VOLTAREN) 75 MG EC tablet Take 75 mg by mouth 2 (two) times daily as needed.     divalproex (DEPAKOTE ER) 500 MG 24 hr tablet 1,500 mg.     inFLIXimab (REMICADE) 100 MG injection Every 8 weeks     norethindrone (MICRONOR) 0.35 MG tablet Take 1 tablet (0.35 mg total) by mouth daily. 28 tablet 0   QUEtiapine (SEROQUEL) 200 MG tablet Take by mouth.     topiramate (TOPAMAX) 25 MG tablet Take 75 mg by mouth at bedtime.     ALPRAZolam (XANAX) 1 MG tablet Take 1 mg by mouth 3 (three) times daily as needed. (Patient not taking: Reported on 07/11/2021)     traZODone (DESYREL) 50 MG tablet Take 50 mg by mouth at bedtime. (Patient not taking: Reported on 07/11/2021)     No current facility-administered medications for this visit.    Family History  Problem Relation Age of Onset   Diabetes Mother    Hypertension Mother    Diabetes Father    Hypertension Father    Alzheimer's disease Father    Heart disease Maternal Grandfather    Heart attack Maternal Grandfather  Lung cancer Paternal Grandfather     Review of Systems  All other systems reviewed and are negative.  Exam:   BP 126/76    Pulse 89    Ht 5' 5"  (1.651 m)    Wt 116 lb 12.8 oz (53 kg)    LMP 07/03/2021    BMI 19.44 kg/m   Height: 5' 5"  (165.1 cm)  General appearance: alert, cooperative and appears stated age Head: Normocephalic, without obvious abnormality, atraumatic Neck: no adenopathy, supple, symmetrical, trachea midline and thyroid normal to inspection and palpation Lungs: clear to auscultation bilaterally Breasts: normal appearance, no masses or tenderness Heart: regular rate and rhythm Abdomen: soft, non-tender; bowel sounds normal; no masses,  no organomegaly Extremities: extremities normal, atraumatic, no cyanosis or edema Skin: Skin color, texture, turgor normal. No rashes or lesions Lymph nodes: Cervical, supraclavicular, and axillary nodes normal. No abnormal inguinal nodes  palpated Neurologic: Grossly normal  Pelvic: External genitalia:  no lesions              Urethra:  normal appearing urethra with no masses, tenderness or lesions              Bartholins and Skenes: normal                 Vagina: normal appearing vagina with normal color and no discharge, no lesions              Cervix: no lesions              Pap taken: No. Bimanual Exam:  Uterus:  normal size, contour, position, consistency, mobility, non-tender              Adnexa: normal adnexa and no mass, fullness, tenderness               Rectovaginal: Confirms               Anus:  normal sphincter tone, no lesions  Chaperone, Octaviano Batty, CMA, was present for exam.  Assessment/Plan: 1. Well woman exam with routine gynecological exam - pap neg with neg HR HPV 2021 - MMG up to date - will try to get date of last cologuard - lab work done with rheumatology - vaccines reviewed.  D/w pt pneumonia vaccination   2. Pituitary microadenoma ([place]) - has follow up DR. Buddy Duty in March  3. Hx of migraines  4. Psoriatic arthritis ([place])  5. Irregular periods/menstrual cycles - norethindrone (MICRONOR) 0.35 MG tablet; Take 1 tablet (0.35 mg total) by mouth daily.  Dispense: 84 tablet; Refill: 4 - Cologuard  6.  Bipolar d/o

## 2021-10-05 ENCOUNTER — Other Ambulatory Visit: Payer: Self-pay | Admitting: Internal Medicine

## 2022-01-21 ENCOUNTER — Other Ambulatory Visit: Payer: Self-pay | Admitting: Obstetrics & Gynecology

## 2022-01-21 DIAGNOSIS — Z1231 Encounter for screening mammogram for malignant neoplasm of breast: Secondary | ICD-10-CM

## 2022-01-29 ENCOUNTER — Ambulatory Visit
Admission: RE | Admit: 2022-01-29 | Discharge: 2022-01-29 | Disposition: A | Discharge status: Home / Self Care | Payer: PRIVATE HEALTH INSURANCE | Source: Ambulatory Visit | Attending: Obstetrics & Gynecology | Admitting: Obstetrics & Gynecology

## 2022-01-29 DIAGNOSIS — Z1231 Encounter for screening mammogram for malignant neoplasm of breast: Secondary | ICD-10-CM

## 2022-05-20 IMAGING — MR MR LUMBAR SPINE WO/W CM
4 of 7 series · 22 of 48 positions shown · IV contrast (13 ml multihance)
Comparison: Intraoperative lumbar spine radiographs 03/26/2021.
Outside lumbar spine MRI 02/16/2021 (images available, report
unavailable).

CLINICAL DATA: Herniated lumbar disc without myelopathy. Additional
history provided: Patient reports prior surgery 03/26/2021. Ongoing
low back pain with left buttock/thigh/calf pain. Left leg weakness
and numbness in left thigh.

EXAM:
MRI LUMBAR SPINE WITHOUT AND WITH CONTRAST
TECHNIQUE: Multiplanar and multiecho pulse sequences of the lumbar spine were
obtained without and with intravenous contrast.
CONTRAST:  13mL MULTIHANCE GADOBENATE DIMEGLUMINE 529 MG/ML IV SOLN

[Series 5: T1 · sagittal · 4.0mm · 0.88mm/px · 4 of 16 slices shown (1 of 2)]
[im 1/16]
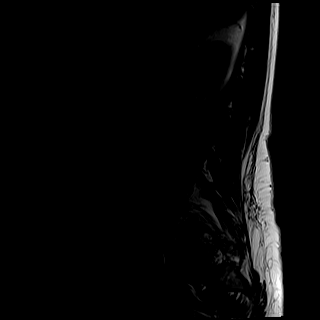
[im 6/16]
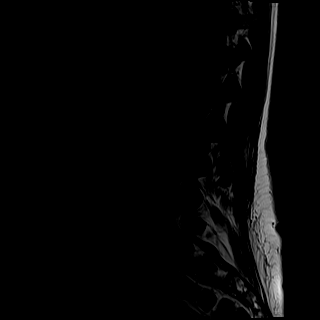
[im 11/16]
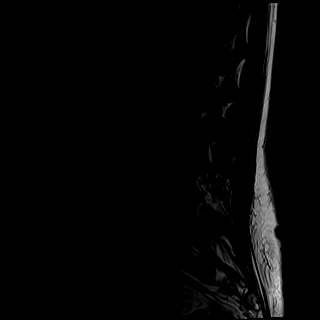
[im 16/16]
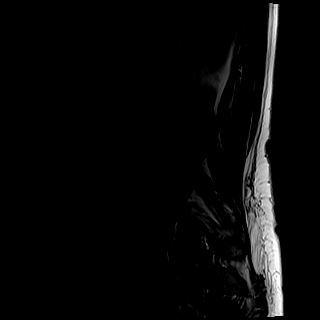

[Series 6: T2 · axial · 4.0mm · 0.39mm/px · z∈[-126,+96]mm · 11 of 43 slices shown (1 of 2)]
[im 1/43]
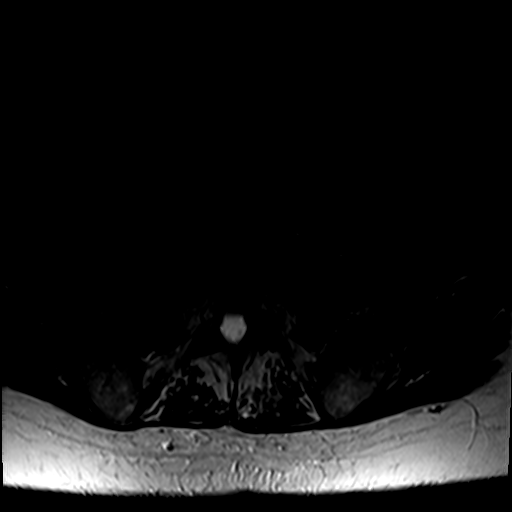
[im 5/43]
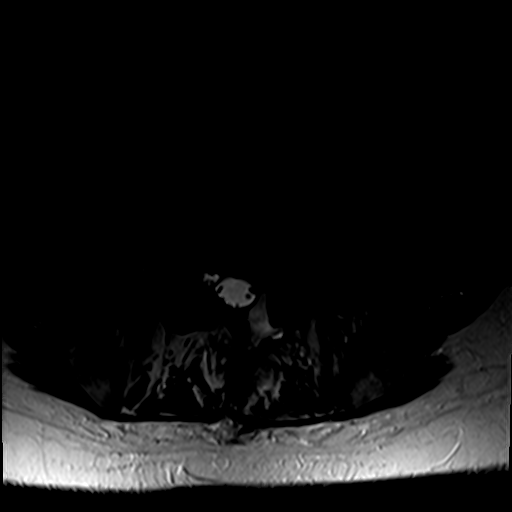
[im 9/43]
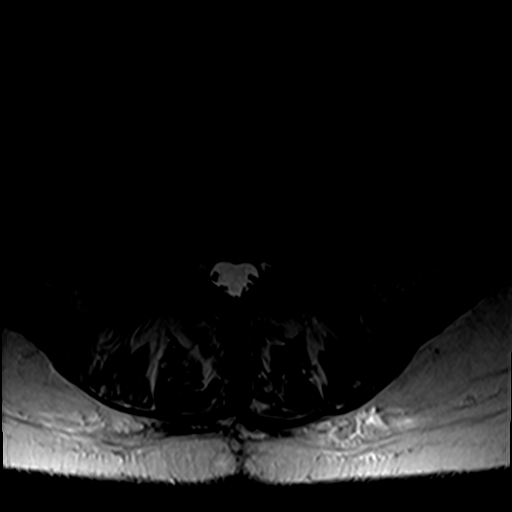
[im 13/43]
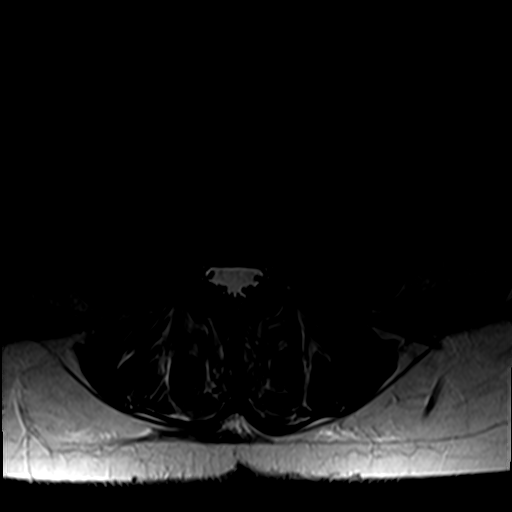
[im 17/43]
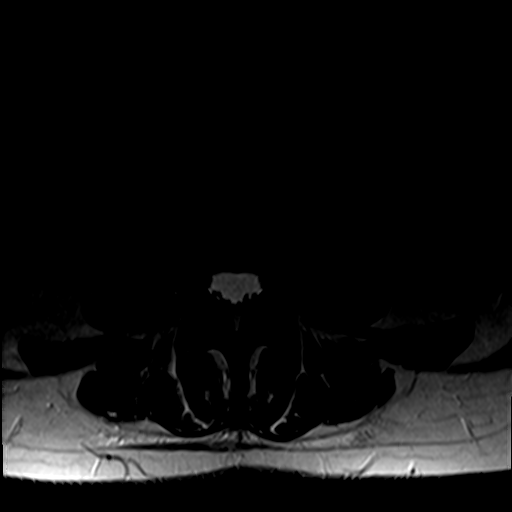
[im 22/43]
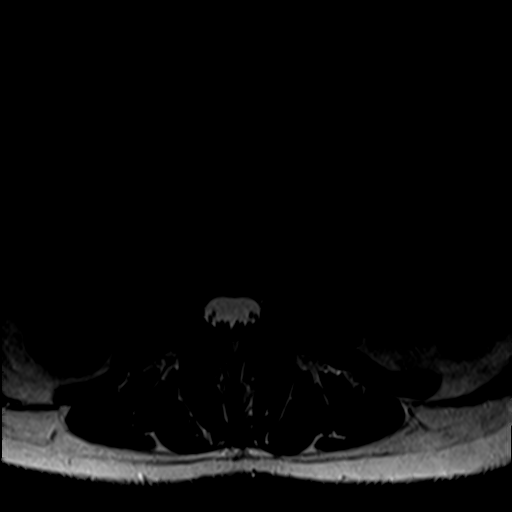
[im 26/43]
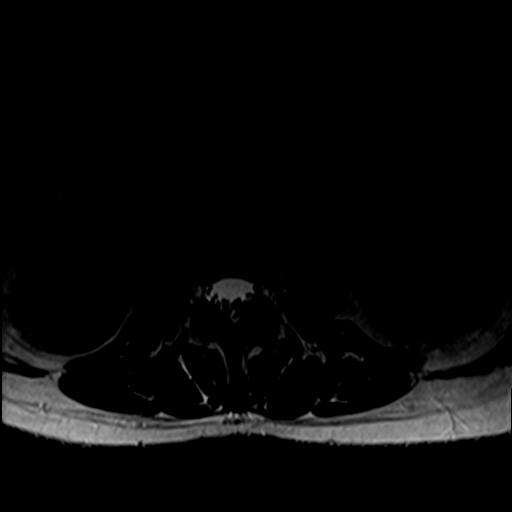
[im 30/43]
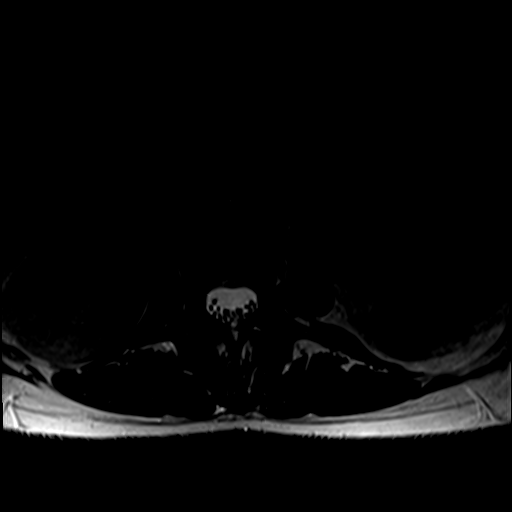
[im 34/43]
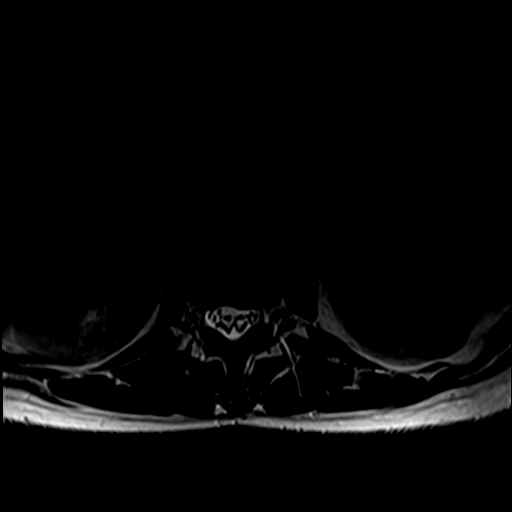
[im 38/43]
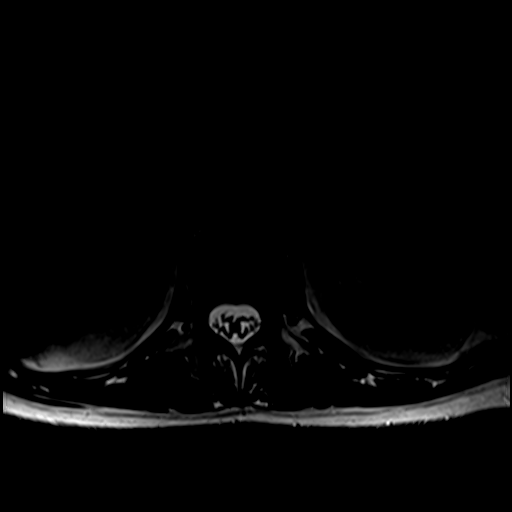
[im 43/43]
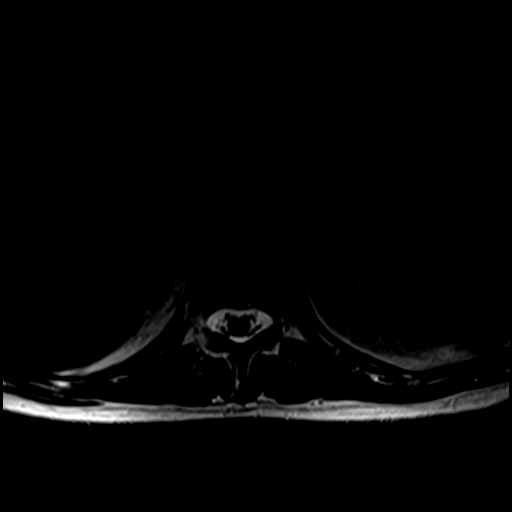

[Series 7: T1 · axial · 4.0mm · 0.39mm/px · z∈[-107,+72]mm · 3 of 43 slices shown (2 of 2)]
[im 5/43]
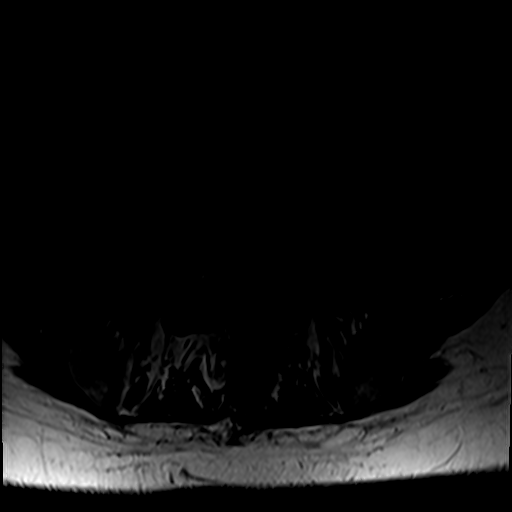
[im 22/43]
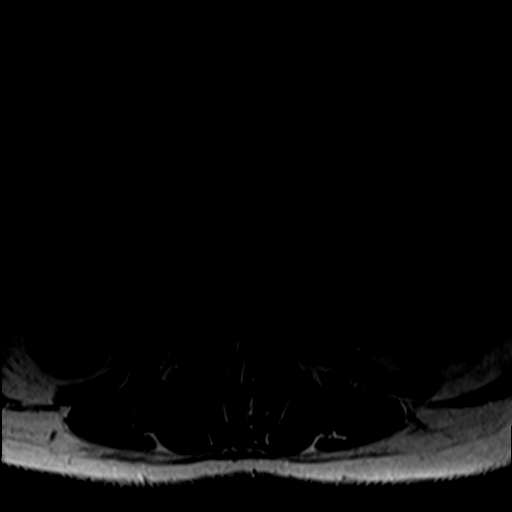
[im 38/43]
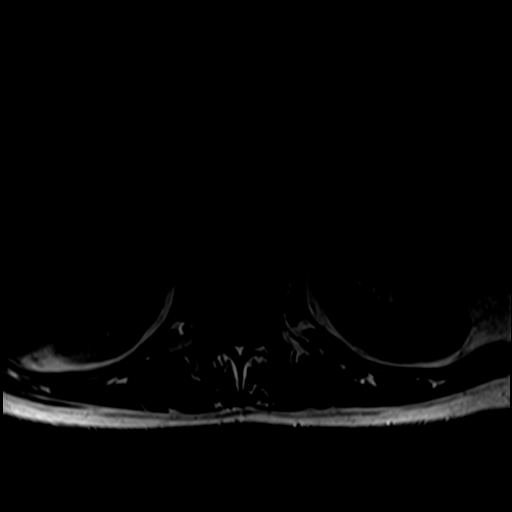

[Series 8: T2 · sagittal · 4.0mm · 1.09mm/px · 4 of 16 slices shown (2 of 2)]
[im 1/16]
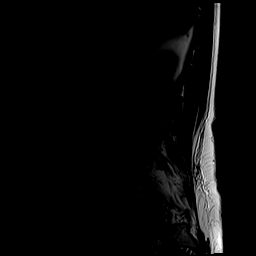
[im 6/16]
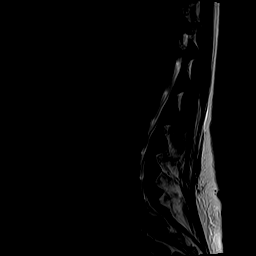
[im 11/16]
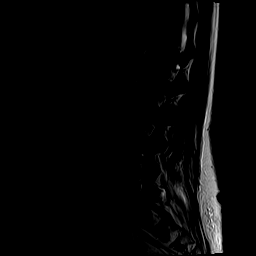
[im 16/16]
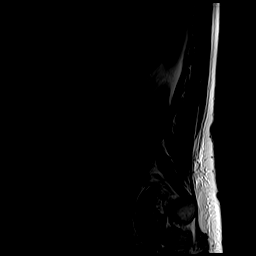

[22 of 48 positions shown; findings below may reference images not displayed]

FINDINGS: Segmentation: 5 lumbar vertebrae are assumed and the caudal most
well-formed intervertebral disc space is designated L5-S1.

Alignment: Trace L1-L2 grade 1 retrolisthesis. Trace L5-S1 grade 1
retrolisthesis.

Vertebrae: Vertebral body height is maintained. No significant
marrow edema or focal suspicious osseous lesion.

Conus medullaris and cauda equina: Conus extends to the L1-L2 level.
No signal abnormality within the visualized distal spinal cord.

Paraspinal and other soft tissues: Cholecystolithiasis. Postsurgical
changes to the dorsal soft tissues on the left at L5-S1.

Disc levels:

Unless otherwise stated, the level by level findings below have not
significantly changed from the prior MRI of 02/16/2021.

Multilevel disc degeneration. Most notably, disc degeneration is
mild-to-moderate at T11-T12, T12-L1 and L1-L2.

T11-T12: Small central disc protrusion. Mild partial effacement of
the ventral thecal sac (without spinal cord mass effect). No
significant foraminal stenosis.

T12-L1: Small disc bulge. No significant spinal canal or foraminal
stenosis.

L1-L2: Trace grade 1 retrolisthesis. Disc bulge. Superimposed
broad-based left center disc protrusion. Mild partial effacement of
the ventral thecal sac (without nerve root impingement). No
significant foraminal stenosis.

L2-L3: Facet arthrosis. No significant disc herniation or stenosis.

L3-L4: Small disc bulge. No significant spinal canal or foraminal
stenosis.

L4-L5: No significant disc herniation or stenosis.

L5-S1: Trace grade 1 retrolisthesis. Interval left laminotomy and
discectomy. There is homogeneous enhancement along the left
anterolateral aspect of the thecal sac, within the left
center/subarticular zone, with imaging features compatible with
epidural fibrosis. This abuts the descending left S1 nerve root. No
evidence of significant residual/recurrent disc herniation. No
significant right subarticular or central canal stenosis. No
significant neural foraminal narrowing.
IMPRESSION: Comparison is made to the prior lumbar spine MRI of 02/16/2021.

At L5-S1, there has been interval left laminotomy and discectomy.
There is homogeneous enhancement along the left anterolateral aspect
of the thecal sac, within the left center/subarticular zone, with
imaging features compatible with epidural fibrosis. This abuts the
descending left S1 nerve root. No evidence of significant
residual/recurrent disc herniation at this site.

Lumbar and lower thoracic spondylosis is otherwise unchanged at the
imaged levels. No more than mild spinal canal narrowing at these
remaining levels. No significant foraminal stenosis.

Cholecystolithiasis.

## 2022-07-15 ENCOUNTER — Ambulatory Visit (HOSPITAL_BASED_OUTPATIENT_CLINIC_OR_DEPARTMENT_OTHER): Payer: PRIVATE HEALTH INSURANCE | Admitting: Obstetrics & Gynecology

## 2022-09-18 ENCOUNTER — Other Ambulatory Visit (HOSPITAL_BASED_OUTPATIENT_CLINIC_OR_DEPARTMENT_OTHER): Payer: Self-pay

## 2022-09-18 DIAGNOSIS — N926 Irregular menstruation, unspecified: Secondary | ICD-10-CM

## 2022-09-18 MED ORDER — NORETHINDRONE 0.35 MG PO TABS: 1.0000 | ORAL_TABLET | Freq: Every day | ORAL | 0 refills | Status: DC

## 2022-10-03 ENCOUNTER — Ambulatory Visit (INDEPENDENT_AMBULATORY_CARE_PROVIDER_SITE_OTHER): Payer: PRIVATE HEALTH INSURANCE | Admitting: Obstetrics & Gynecology

## 2022-10-03 ENCOUNTER — Encounter (HOSPITAL_BASED_OUTPATIENT_CLINIC_OR_DEPARTMENT_OTHER): Payer: Self-pay | Admitting: Obstetrics & Gynecology

## 2022-10-03 VITALS — BP 106/76 | HR 80 | Ht 65.0 in | Wt 130.2 lb

## 2022-10-03 DIAGNOSIS — F319 Bipolar disorder, unspecified: Secondary | ICD-10-CM | POA: Diagnosis not present

## 2022-10-03 DIAGNOSIS — Z1211 Encounter for screening for malignant neoplasm of colon: Secondary | ICD-10-CM

## 2022-10-03 DIAGNOSIS — Z8669 Personal history of other diseases of the nervous system and sense organs: Secondary | ICD-10-CM

## 2022-10-03 DIAGNOSIS — N926 Irregular menstruation, unspecified: Secondary | ICD-10-CM | POA: Diagnosis not present

## 2022-10-03 DIAGNOSIS — Z01419 Encounter for gynecological examination (general) (routine) without abnormal findings: Secondary | ICD-10-CM

## 2022-10-03 MED ORDER — NORETHINDRONE 0.35 MG PO TABS: 1.0000 | ORAL_TABLET | Freq: Every day | ORAL | 4 refills | Status: DC

## 2022-10-03 NOTE — Progress Notes (Signed)
[name] Married White or Caucasian female here for annual exam.  Reports she's doing well.  Does ballet to help with strength.  Back is stable.  On lyrica.  Has been able to get stop her suboxone.  She is on wellbutrin, seroquel, and Caplyta.  Is taking micronor.  Has a small amount of dark brown discharge.    Followed by Dr. Evelene Croon form psychiatry standpoint.  Has appt in two weeks.  Feeling some more mood changes that may be associated with the bleeding.    Seeing Georgette Shell, PCP, in two weeks.  Will have lab work done at that appointment.   Had issues with fever blisters and she is taking daily valtrex.  No LMP recorded. (Menstrual status: Irregular Periods).          Sexually active: Yes.    The current method of family planning is oral progesterone-only contraceptive.    Exercising: yes Smoker:  vapes with nicotine  Health Maintenance: Pap:  05/13/2020 Negative History of abnormal Pap:  no MMG:  01/29/2022 Negative Colonoscopy:  declined but willing to do cologuard Screening Labs: will do with sara spencer   reports that she has never smoked. She has never used smokeless tobacco. She reports that she does not drink alcohol and does not use drugs.  Past Medical History:  Diagnosis Date   Anxiety    Bipolar disorder    COVID-19    Depression    Hypertension    Migraine with aura    Psoriatic arthritis     Past Surgical History:  Procedure Laterality Date   AUGMENTATION MAMMAPLASTY Bilateral    CESAREAN SECTION     DILATION AND CURETTAGE OF UTERUS     MICRODISCECTOMY LUMBAR  03/2021   SALPINGECTOMY     left side   TUBAL LIGATION      Current Outpatient Medications  Medication Sig Dispense Refill   ALPRAZolam (XANAX) 1 MG tablet Take 1 mg by mouth 3 (three) times daily as needed.     amphetamine-dextroamphetamine (ADDERALL XR) 30 MG 24 hr capsule Take 15 mg by mouth daily.     atenolol (TENORMIN) 25 MG tablet Take by mouth daily.     Botulinum Toxin Type A  200 units SOLR Every 3 months     cabergoline (DOSTINEX) 0.5 MG tablet Take 0.25 mg by mouth 2 (two) times a week.     CAPLYTA 42 MG capsule Take 42 mg by mouth at bedtime.     diclofenac (VOLTAREN) 75 MG EC tablet Take 75 mg by mouth 2 (two) times daily as needed.     inFLIXimab (REMICADE) 100 MG injection Every 8 weeks     prazosin (MINIPRESS) 2 MG capsule Take 2 mg by mouth at bedtime.     pregabalin (LYRICA) 50 MG capsule Take 50 mg by mouth 2 (two) times daily.     QUEtiapine (SEROQUEL) 200 MG tablet Take by mouth.     topiramate (TOPAMAX) 25 MG tablet Take 75 mg by mouth at bedtime.     traZODone (DESYREL) 50 MG tablet Take 50 mg by mouth at bedtime.     norethindrone (MICRONOR) 0.35 MG tablet Take 1 tablet (0.35 mg total) by mouth daily. 84 tablet 4   No current facility-administered medications for this visit.    Family History  Problem Relation Age of Onset   Diabetes Mother    Hypertension Mother    Diabetes Father    Hypertension Father    Alzheimer's disease  Father    Heart disease Maternal Grandfather    Heart attack Maternal Grandfather    Lung cancer Paternal Grandfather     ROS: Constitutional: negative Genitourinary:negative  Exam:   BP 106/76 (BP Location: Right Arm, Patient Position: Sitting, Cuff Size: Normal)   Pulse 80   Ht 5\' 5"  (1.651 m) Comment: reported  Wt 130 lb 3.2 oz (59.1 kg)   BMI 21.67 kg/m   Height: 5\' 5"  (165.1 cm) (reported)  General appearance: alert, cooperative and appears stated age Head: Normocephalic, without obvious abnormality, atraumatic Neck: no adenopathy, supple, symmetrical, trachea midline and thyroid normal to inspection and palpation Lungs: clear to auscultation bilaterally Breasts: normal appearance, no masses or tenderness Heart: regular rate and rhythm Abdomen: soft, non-tender; bowel sounds normal; no masses,  no organomegaly Extremities: extremities normal, atraumatic, no cyanosis or edema Skin: Skin color,  texture, turgor normal. No rashes or lesions Lymph nodes: Cervical, supraclavicular, and axillary nodes normal. No abnormal inguinal nodes palpated Neurologic: Grossly normal   Pelvic: External genitalia:  no lesions              Urethra:  normal appearing urethra with no masses, tenderness or lesions              Bartholins and Skenes: normal                 Vagina: normal appearing vagina with normal color and no discharge, no lesions              Cervix: no lesions              Pap taken: Yes.   Bimanual Exam:  Uterus:  normal size, contour, position, consistency, mobility, non-tender              Adnexa: normal adnexa and no mass, fullness, tenderness               Rectovaginal: Confirms               Anus:  normal sphincter tone, no lesions  Chaperone, Ina Homes, CMA, was present for exam.  Assessment/Plan: 1. Well woman exam with routine gynecological exam - Pap smear neg with neg HR HPV 04/2020 - Mammogram up to date - Colonoscopy declined.  Will do cologuard.  Ordered - Bone mineral density not indicated - lab work done with PCP, Georgette Shell - vaccines reviewed/updated   2. Irregular periods/menstrual cycles - norethindrone (MICRONOR) 0.35 MG tablet; Take 1 tablet (0.35 mg total) by mouth daily.  Dispense: 84 tablet; Refill: 4  3. Colon cancer screening - Cologuard  4. Bipolar disease, chronic - followed by Dr. Evelene Croon  5. History of migraine headaches

## 2022-10-08 ENCOUNTER — Encounter (HOSPITAL_BASED_OUTPATIENT_CLINIC_OR_DEPARTMENT_OTHER): Payer: Self-pay | Admitting: Obstetrics & Gynecology

## 2022-11-05 ENCOUNTER — Other Ambulatory Visit (HOSPITAL_BASED_OUTPATIENT_CLINIC_OR_DEPARTMENT_OTHER): Payer: Self-pay | Admitting: Obstetrics & Gynecology

## 2022-11-05 DIAGNOSIS — R6882 Decreased libido: Secondary | ICD-10-CM

## 2022-11-05 MED ORDER — NONFORMULARY OR COMPOUNDED ITEM
1 refills | Status: DC
Start: 2022-11-05 — End: 2023-04-07

## 2022-11-06 ENCOUNTER — Other Ambulatory Visit (HOSPITAL_BASED_OUTPATIENT_CLINIC_OR_DEPARTMENT_OTHER): Payer: Self-pay | Admitting: Obstetrics & Gynecology

## 2022-11-06 DIAGNOSIS — R6882 Decreased libido: Secondary | ICD-10-CM

## 2022-11-06 NOTE — Telephone Encounter (Signed)
DOB verified. Informed pt that rx for testosterone had been sent to [facility]. Advised that testosterone level should be checked 3 months after using. Pt provided with appt.

## 2022-12-22 LAB — COLOGUARD: COLOGUARD: NEGATIVE

## 2023-01-21 ENCOUNTER — Telehealth (HOSPITAL_BASED_OUTPATIENT_CLINIC_OR_DEPARTMENT_OTHER): Payer: Self-pay | Admitting: *Deleted

## 2023-01-21 NOTE — Telephone Encounter (Signed)
Pt called with complaints of some reddened spots in her vaginal area. She said it started with just a couple and now there are 8-10. She requests appt for evaluation. Pt provided with appt

## 2023-01-22 ENCOUNTER — Ambulatory Visit (HOSPITAL_BASED_OUTPATIENT_CLINIC_OR_DEPARTMENT_OTHER): Payer: PRIVATE HEALTH INSURANCE | Admitting: Student

## 2023-01-22 ENCOUNTER — Encounter (HOSPITAL_BASED_OUTPATIENT_CLINIC_OR_DEPARTMENT_OTHER): Payer: Self-pay | Admitting: Student

## 2023-01-22 VITALS — BP 117/82 | HR 70 | Ht 65.5 in | Wt 129.2 lb

## 2023-01-22 DIAGNOSIS — N898 Other specified noninflammatory disorders of vagina: Secondary | ICD-10-CM

## 2023-01-22 NOTE — Progress Notes (Signed)
  History:  Ms. Courtney Valentine is a 52 y.o. [id number] who presents to clinic today for brown spots/bumps that have beein developing. First one that is documented by photo was in May of this year. Patient visually noted 1-2 spots approximately two weeks ago and the lower region of her vagina. States they started out flat but have progressed to a raised bump. When reassessing the area she visualized that new flat brown spots had delveloped. Denies any pain, discharge, burning, itching, odor or discharge.   The following portions of the patient's history were reviewed and updated as appropriate: allergies, current medications, family history, past medical history, social history, past surgical history and problem list.  Review of Systems:  Review of Systems  Constitutional: Negative.   Gastrointestinal: Negative.   Genitourinary:  Negative for dysuria and urgency.       Brown spots on vagina  Neurological: Negative.   Psychiatric/Behavioral: Negative.        Objective:  Physical Exam BP 117/82 (BP Location: Right Arm, Patient Position: Sitting, Cuff Size: Normal)   Pulse 70   Ht 5' 5.5" (1.664 m) Comment: Reported  Wt 129 lb 3.2 oz (58.6 kg)   BMI 21.17 kg/m  Physical Exam Constitutional:      Appearance: Normal appearance.  Genitourinary:      Comments: Left lower cluster has two rounded, raised bumps with <56mm diameter Skin:    General: Skin is warm and dry.  Neurological:     Mental Status: She is alert.  Psychiatric:        Mood and Affect: Mood normal.        Behavior: Behavior normal.       Labs and Imaging No results found for this or any previous visit (from the past 24 hour(s)).  No results found.  Health Maintenance Due  Topic Date Due   HIV Screening  Never done   Hepatitis C Screening  Never done   Zoster Vaccines- Shingrix (1 of 2) Never done   COVID-19 Vaccine (4 - 2023-24 season) 09/07/2022    Labs, imaging and previous visits in Epic and Care  Everywhere reviewed  Assessment & Plan:  1. Vaginal lesion - Differential diagnosis Seborric keratosis vs Dermatofibromas vs physiologic hyperpigmentation vs. Angiomas vs. Idiopathic finding  - Phone consult to Dr. Ashok Pall to confirm best next steps. Md recommended dermatology consult - Ambulatory referral to Dermatology    Approximately 20 minutes of total time was spent with this patient on direct face-to-face care and exam. The remainder of visit was spent reviewing chart and consult.  Return in about 3 months (around 04/24/2023) for 2-3 months , IN-PERSON.  Corlis Hove, NP 01/22/2023 12:32 PM

## 2023-02-06 ENCOUNTER — Other Ambulatory Visit (HOSPITAL_BASED_OUTPATIENT_CLINIC_OR_DEPARTMENT_OTHER): Payer: PRIVATE HEALTH INSURANCE

## 2023-02-06 DIAGNOSIS — R6882 Decreased libido: Secondary | ICD-10-CM

## 2023-02-11 LAB — TESTOSTERONE, TOTAL, LC/MS/MS: Testosterone, total: 15.6 ng/dL

## 2023-04-07 ENCOUNTER — Encounter (HOSPITAL_BASED_OUTPATIENT_CLINIC_OR_DEPARTMENT_OTHER): Payer: Self-pay | Admitting: Certified Nurse Midwife

## 2023-04-07 ENCOUNTER — Ambulatory Visit (INDEPENDENT_AMBULATORY_CARE_PROVIDER_SITE_OTHER): Payer: PRIVATE HEALTH INSURANCE | Admitting: Certified Nurse Midwife

## 2023-04-07 VITALS — BP 132/88 | HR 73 | Ht 65.5 in | Wt 138.8 lb

## 2023-04-07 DIAGNOSIS — N926 Irregular menstruation, unspecified: Secondary | ICD-10-CM

## 2023-04-07 DIAGNOSIS — N951 Menopausal and female climacteric states: Secondary | ICD-10-CM

## 2023-04-07 DIAGNOSIS — R6882 Decreased libido: Secondary | ICD-10-CM

## 2023-04-07 MED ORDER — PROGESTERONE MICRONIZED 100 MG PO CAPS: 100.0000 mg | ORAL_CAPSULE | Freq: Every day | ORAL | 3 refills | Status: DC

## 2023-04-07 MED ORDER — NONFORMULARY OR COMPOUNDED ITEM: 1 refills | Status: DC

## 2023-04-07 MED ORDER — ESTRADIOL 0.1 MG/24HR TD PTTW: 1.0000 | MEDICATED_PATCH | TRANSDERMAL | 12 refills | Status: DC

## 2023-04-07 NOTE — Progress Notes (Unsigned)
   GYNECOLOGY  VISIT  CC:   Pt reports +hot flashes, fatigue, night sweats, issues with memory, low libido. Started testosterone but does not feel like it has helped with libido yet. She requests to try a higher dosage of testosterone. Pt reports recent irregular periods. Experienced a few days of spotting recently (pt unsure if this was a menstrual period).   HPI: 52 y.o. [id number] Married White or Caucasian female here for problem gyn visit-see above.   Past Medical History:  Diagnosis Date   Anxiety    Bipolar disorder (HCC)    COVID-19    Depression    Hypertension    Migraine with aura    Psoriatic arthritis (HCC)     MEDS:   Current Outpatient Medications on File Prior to Visit  Medication Sig Dispense Refill   ALPRAZolam (XANAX) 1 MG tablet Take 1 mg by mouth 3 (three) times daily as needed.     amphetamine-dextroamphetamine (ADDERALL XR) 30 MG 24 hr capsule Take 15 mg by mouth daily.     atenolol (TENORMIN) 25 MG tablet Take by mouth daily.     Botulinum Toxin Type A 200 units SOLR Every 3 months     cabergoline (DOSTINEX) 0.5 MG tablet Take 0.25 mg by mouth 2 (two) times a week.     CAPLYTA 42 MG capsule Take 42 mg by mouth at bedtime.     diclofenac (VOLTAREN) 75 MG EC tablet Take 75 mg by mouth 2 (two) times daily as needed.     norethindrone (MICRONOR) 0.35 MG tablet Take 1 tablet (0.35 mg total) by mouth daily. 84 tablet 4   prazosin (MINIPRESS) 2 MG capsule Take 2 mg by mouth at bedtime.     pregabalin (LYRICA) 50 MG capsule Take 50 mg by mouth 2 (two) times daily.     QUEtiapine (SEROQUEL) 200 MG tablet Take by mouth.     topiramate (TOPAMAX) 25 MG tablet Take 75 mg by mouth at bedtime.     traZODone (DESYREL) 50 MG tablet Take 50 mg by mouth at bedtime.     No current facility-administered medications on file prior to visit.    ALLERGIES: Sulfa antibiotics and Lamictal [lamotrigine]  ROS  PHYSICAL EXAMINATION:    BP 132/88 (BP Location: Left Arm, Patient  Position: Sitting, Cuff Size: Normal)   Pulse 73   Ht 5' 5.5" (1.664 m)   Wt 138 lb 12.8 oz (63 kg)   BMI 22.75 kg/m     General appearance: alert, cooperative and appears stated age   1. Decreased libido -Pt previously counseled on testosterone. Pt requests increase in dosage in hopes that will help low libido.  - NONFORMULARY OR COMPOUNDED ITEM; Topical testosterone cream 1mg /0.30ml.  Apply topically 0.67ml topically daily (arms/legs/abdomen)  Disp: 3 month supply.  #1RF.  Dispense: 1 each; Refill: 1  2. Irregular menses RTO for Pelvic GYN [place] to evaluate uterus/ovaries/endometrial lining. Will consider endometrial biopsy if indicated.   3. Vasomotor symptoms due to menopause -Pt would like to trial estrogen/progesterone due to severe vasomotor symptoms (hot flashes/night sweats). Rx sent. Reviewed risks of HRT.  RTO 1-2 weeks for [place] and follow-up with Courtney Valentine CNM.Courtney Valentine

## 2023-04-16 ENCOUNTER — Ambulatory Visit (INDEPENDENT_AMBULATORY_CARE_PROVIDER_SITE_OTHER): Payer: PRIVATE HEALTH INSURANCE

## 2023-04-16 ENCOUNTER — Ambulatory Visit (INDEPENDENT_AMBULATORY_CARE_PROVIDER_SITE_OTHER): Payer: PRIVATE HEALTH INSURANCE | Admitting: Certified Nurse Midwife

## 2023-04-16 ENCOUNTER — Encounter (HOSPITAL_BASED_OUTPATIENT_CLINIC_OR_DEPARTMENT_OTHER): Payer: Self-pay | Admitting: Certified Nurse Midwife

## 2023-04-16 VITALS — BP 124/88 | HR 79 | Ht 65.5 in | Wt 136.2 lb

## 2023-04-16 DIAGNOSIS — N951 Menopausal and female climacteric states: Secondary | ICD-10-CM | POA: Diagnosis not present

## 2023-04-16 DIAGNOSIS — N926 Irregular menstruation, unspecified: Secondary | ICD-10-CM | POA: Diagnosis not present

## 2023-04-16 NOTE — Progress Notes (Unsigned)
  GYNECOLOGY  VISIT  CC:   ultrasound  HPI: 52 y.o. G3P0012 Married White or Caucasian female here for follow-up to her visit on 04/07/23. At that time she reported hot flashes, night sweats, fatigue, issues with memory, low libido and irregular menses. She has started her nightly Progesterone po and estradiol patches-unsure but she thinks the hot flashes "may" be decreasing.   04/16/23 Courtney Valentine. No latex used. Pelvic structures were imaged in sagittal and transverse orientation transvaginally. Anteverted uterus appears normal in size without evidence of focal abnormalities. EM 2mm with no colorflow noted. Bilateral ovaries appear normal with bloodflow, left OV follicle 1.9 x .9cm. Neg adnexa regions bilaterally. Neg cul de sac. No free fluid present. CXL wnl.    Past Medical History:  Diagnosis Date   Anxiety    Bipolar disorder (HCC)    COVID-19    Depression    Hypertension    Migraine with aura    Psoriatic arthritis (HCC)     MEDS:   Current Outpatient Medications on File Prior to Visit  Medication Sig Dispense Refill   ALPRAZolam (XANAX) 1 MG tablet Take 1 mg by mouth 3 (three) times daily as needed.     amphetamine-dextroamphetamine (ADDERALL XR) 30 MG 24 hr capsule Take 15 mg by mouth daily.     atenolol (TENORMIN) 25 MG tablet Take by mouth daily.     Botulinum Toxin Type A 200 units SOLR Every 3 months     cabergoline (DOSTINEX) 0.5 MG tablet Take 0.25 mg by mouth 2 (two) times a week.     CAPLYTA 42 MG capsule Take 42 mg by mouth at bedtime.     diclofenac (VOLTAREN) 75 MG EC tablet Take 75 mg by mouth 2 (two) times daily as needed.     estradiol (VIVELLE-DOT) 0.1 MG/24HR patch Place 1 patch (0.1 mg total) onto the skin 2 (two) times a week. 8 patch 12   NONFORMULARY OR COMPOUNDED ITEM Topical testosterone cream 1mg /0.93ml.  Apply topically 0.72ml topically daily (arms/legs/abdomen)  Disp: 3 month supply.  #1RF. 1 each 1   norethindrone (MICRONOR) 0.35 MG tablet  Take 1 tablet (0.35 mg total) by mouth daily. 84 tablet 4   prazosin (MINIPRESS) 2 MG capsule Take 2 mg by mouth at bedtime.     pregabalin (LYRICA) 50 MG capsule Take 50 mg by mouth 2 (two) times daily.     progesterone (PROMETRIUM) 100 MG capsule Take 1 capsule (100 mg total) by mouth daily. 30 capsule 3   QUEtiapine (SEROQUEL) 200 MG tablet Take by mouth.     topiramate (TOPAMAX) 25 MG tablet Take 75 mg by mouth at bedtime.     traZODone (DESYREL) 50 MG tablet Take 50 mg by mouth at bedtime.     No current facility-administered medications on file prior to visit.    ALLERGIES: Sulfa antibiotics and Lamictal [lamotrigine]  SH:  Lives with supportive spouse  ROS  PHYSICAL EXAMINATION:    BP 124/88 (BP Location: Left Arm, Patient Position: Sitting, Cuff Size: Normal)   Pulse 79   Ht 5' 5.5" (1.664 m)   Wt 136 lb 3.2 oz (61.8 kg)   BMI 22.32 kg/m     General appearance: alert, cooperative and appears stated age  Assessment/Plan: 1. Irregular menses/Perimenopause -Normal GYN US findings -Pt will continue trial of estrogen patch/progesterone.  Follow-up as scheduled for next annual gyn exam and prn if issues arise. [name]

## 2023-04-22 ENCOUNTER — Ambulatory Visit (HOSPITAL_BASED_OUTPATIENT_CLINIC_OR_DEPARTMENT_OTHER): Payer: Self-pay | Admitting: Obstetrics & Gynecology

## 2023-05-21 ENCOUNTER — Encounter (HOSPITAL_BASED_OUTPATIENT_CLINIC_OR_DEPARTMENT_OTHER): Payer: Self-pay | Admitting: Certified Nurse Midwife

## 2023-05-26 ENCOUNTER — Encounter (HOSPITAL_BASED_OUTPATIENT_CLINIC_OR_DEPARTMENT_OTHER): Payer: Self-pay | Admitting: Certified Nurse Midwife

## 2023-05-27 ENCOUNTER — Telehealth (HOSPITAL_BASED_OUTPATIENT_CLINIC_OR_DEPARTMENT_OTHER): Payer: PRIVATE HEALTH INSURANCE | Admitting: Certified Nurse Midwife

## 2023-05-27 ENCOUNTER — Encounter (HOSPITAL_BASED_OUTPATIENT_CLINIC_OR_DEPARTMENT_OTHER): Payer: Self-pay | Admitting: Certified Nurse Midwife

## 2023-05-27 DIAGNOSIS — N951 Menopausal and female climacteric states: Secondary | ICD-10-CM | POA: Diagnosis not present

## 2023-05-27 MED ORDER — NORETHINDRONE ACETATE 5 MG PO TABS: 2.5000 mg | ORAL_TABLET | Freq: Every day | ORAL | 3 refills | Status: DC

## 2023-05-27 NOTE — Progress Notes (Signed)
Virtual Visit via Video Note  I connected with Courtney Valentine on 05/27/23 at  8:15 AM EST by a video enabled telemedicine application and verified that I am speaking with the correct person using two identifiers (name/DOB)  Location: Patient: Home Provider: Office   I discussed the limitations of evaluation and management by telemedicine and the availability of in person appointments. The patient expressed understanding and agreed to proceed.  History of Present Illness:  Courtney Valentine is a 52yo MWF G3P2. She was evaluated initially 04/07/23 by me with complaints of "hot flashes, fatigue, night sweats, issues with memory, low libido". She had started testosterone for libido and requested to try a higher dose. Testosterone was increased. Pt was started on Vivelle-Dot and Progesterone at bedtime.  Today's appointment was requested by patient. Today she states the hot flashes "have reduced". States "I have noticed the most that the hot flashes are not as bad as before"; however, the patient reports "the memory piece is still terrible". "The moodiness is there and more than before", "the fatigue has gotten some better". She denies night sweats but "I do get hot at night but not to the point of being sweaty". Pt reiterates her main concern right now remains the "memory piece". States "I can't get it together in my head", "I can't focus despite being on Adderall", "I can't multi-task".   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 She takes Melatonin at bedtime. She has a follow-up with Neurology scheduled for January.   Pt also mentions that she took norethindrone in the past.    Observations/Objective:   Assessment and Plan: Postmenopausal vasomotor symptoms  Follow Up Instructions: Will change progesterone therapy to Aygestin 2.5mg  po at bedtime (pt tolerated Aygestin in the past). Continue Vivelle-Dot and  Testosterone   I discussed the assessment and treatment plan with the patient. The patient was provided an opportunity to ask questions and all were answered. The patient agreed with the plan and demonstrated an understanding of the instructions.   The patient was advised to call back or seek an in-person evaluation if the symptoms worsen or if the condition fails to improve as anticipated.  I provided 20 minutes of non-face-to-face time during this encounter.  Norethindrone/  [name], CNM

## 2023-07-14 ENCOUNTER — Other Ambulatory Visit (HOSPITAL_BASED_OUTPATIENT_CLINIC_OR_DEPARTMENT_OTHER): Payer: Self-pay | Admitting: Certified Nurse Midwife

## 2023-07-14 ENCOUNTER — Encounter (HOSPITAL_BASED_OUTPATIENT_CLINIC_OR_DEPARTMENT_OTHER): Payer: Self-pay | Admitting: Certified Nurse Midwife

## 2023-07-14 MED ORDER — NORETHINDRONE ACETATE 5 MG PO TABS: 2.5000 mg | ORAL_TABLET | Freq: Every day | ORAL | 3 refills | Status: DC

## 2023-07-17 ENCOUNTER — Other Ambulatory Visit (HOSPITAL_BASED_OUTPATIENT_CLINIC_OR_DEPARTMENT_OTHER): Payer: Self-pay | Admitting: *Deleted

## 2023-07-17 MED ORDER — NORETHINDRONE ACETATE 5 MG PO TABS: 2.5000 mg | ORAL_TABLET | Freq: Every day | ORAL | 3 refills | Status: DC

## 2023-08-13 ENCOUNTER — Encounter (HOSPITAL_BASED_OUTPATIENT_CLINIC_OR_DEPARTMENT_OTHER): Payer: Self-pay | Admitting: Certified Nurse Midwife

## 2023-08-14 ENCOUNTER — Other Ambulatory Visit (HOSPITAL_BASED_OUTPATIENT_CLINIC_OR_DEPARTMENT_OTHER): Payer: Self-pay | Admitting: Certified Nurse Midwife

## 2023-08-14 MED ORDER — ESTRADIOL 1 MG PO TABS: 1.0000 mg | ORAL_TABLET | Freq: Every day | ORAL | 3 refills | Status: DC

## 2023-09-08 ENCOUNTER — Encounter (HOSPITAL_BASED_OUTPATIENT_CLINIC_OR_DEPARTMENT_OTHER): Payer: Self-pay | Admitting: Certified Nurse Midwife

## 2023-09-12 ENCOUNTER — Other Ambulatory Visit (HOSPITAL_COMMUNITY)
Admission: RE | Admit: 2023-09-12 | Discharge: 2023-09-12 | Disposition: A | Discharge status: Home / Self Care | Payer: PRIVATE HEALTH INSURANCE | Source: Ambulatory Visit | Attending: Certified Nurse Midwife | Admitting: Certified Nurse Midwife

## 2023-09-12 ENCOUNTER — Ambulatory Visit (INDEPENDENT_AMBULATORY_CARE_PROVIDER_SITE_OTHER): Payer: PRIVATE HEALTH INSURANCE | Admitting: Certified Nurse Midwife

## 2023-09-12 VITALS — BP 153/90 | HR 70 | Ht 65.5 in | Wt 152.0 lb

## 2023-09-12 DIAGNOSIS — R040 Epistaxis: Secondary | ICD-10-CM | POA: Diagnosis not present

## 2023-09-12 DIAGNOSIS — N939 Abnormal uterine and vaginal bleeding, unspecified: Secondary | ICD-10-CM | POA: Insufficient documentation

## 2023-09-12 DIAGNOSIS — Z124 Encounter for screening for malignant neoplasm of cervix: Secondary | ICD-10-CM | POA: Diagnosis not present

## 2023-09-12 LAB — CBC WITH DIFFERENTIAL/PLATELET
Basophils Absolute: 0 10*3/uL (ref 0.0–0.2)
Basos: 1 %
EOS (ABSOLUTE): 0.2 10*3/uL (ref 0.0–0.4)
Eos: 4 %
Hematocrit: 39.1 % (ref 34.0–46.6)
Hemoglobin: 13.2 g/dL (ref 11.1–15.9)
Immature Grans (Abs): 0 10*3/uL (ref 0.0–0.1)
Immature Granulocytes: 0 %
Lymphocytes Absolute: 0.8 10*3/uL (ref 0.7–3.1)
Lymphs: 17 %
MCH: 31.6 pg (ref 26.6–33.0)
MCHC: 33.8 g/dL (ref 31.5–35.7)
MCV: 94 fL (ref 79–97)
Monocytes Absolute: 0.3 10*3/uL (ref 0.1–0.9)
Monocytes: 8 %
Neutrophils Absolute: 3.1 10*3/uL (ref 1.4–7.0)
Neutrophils: 70 %
Platelets: 230 10*3/uL (ref 150–450)
RBC: 4.18 x10E6/uL (ref 3.77–5.28)
RDW: 12.2 % (ref 11.7–15.4)
WBC: 4.4 10*3/uL (ref 3.4–10.8)

## 2023-09-12 NOTE — Progress Notes (Signed)
   Subjective:     Courtney Valentine is a 53 y.o. female here for irregular bleeding. Pt thinks she had menstrual period around September or October 2024. She did not have any menstrual bleeding in Dec/Jan/Feb. She had COVID 2/25 and felt like she started to experience intermittent spotting "brown", "deep-red" after that. States "one day I will spot and then the next day it may stop", "bleeding starts up again". She has experienced this intermittent spotting "all of March".  Pt/CNM communicated and pt was agreeable to endometrial biopsy.   The following portions of the patient's history were reviewed and updated as appropriate: allergies, current medications, past family history, past medical history, past social history, past surgical history, and problem list.  Review of Systems Pertinent items are noted in HPI.    Objective:    BP (!) 153/90 (BP Location: Left Arm, Patient Position: Sitting, Cuff Size: Normal)   Pulse 70   Ht 5' 5.5" (1.664 m)   Wt 152 lb (68.9 kg)   BMI 24.91 kg/m  General appearance: alert, cooperative, and appears stated age Pelvic: cervix normal in appearance, external genitalia normal, no adnexal masses or tenderness, no cervical motion tenderness, rectovaginal septum normal, uterus normal size, shape, and consistency, and vagina normal without discharge    Assessment:    AUB .    Plan:    ENDOMETRIAL BIOPSY     The indications for endometrial biopsy were reviewed.   Risks of the biopsy including cramping, bleeding, infection, uterine perforation, inadequate specimen and need for additional procedures  were discussed. The patient states she understands and agrees to undergo procedure today. Consent was signed. Time out was performed. Urine HCG was negative. A sterile speculum was placed in the patient's vagina and the cervix was prepped with Betadine. A single-toothed tenaculum was placed on the anterior lip of the cervix to stabilize it. The 3 mm pipelle was  introduced into the endometrial cavity without difficulty to a depth of 9 cm, and a moderate amount of tissue was obtained and sent to pathology. The instruments were removed from the patient's vagina. Minimal bleeding from the cervix was noted. The patient tolerated the procedure well. Routine post-procedure instructions were given to the patient. The patient will follow up to review the results and for further management.      [name]

## 2023-09-15 LAB — SURGICAL PATHOLOGY

## 2023-09-17 LAB — CYTOLOGY - PAP
Comment: NEGATIVE
Diagnosis: NEGATIVE
Diagnosis: REACTIVE
High risk HPV: NEGATIVE

## 2023-09-23 ENCOUNTER — Encounter (HOSPITAL_BASED_OUTPATIENT_CLINIC_OR_DEPARTMENT_OTHER): Payer: Self-pay | Admitting: Certified Nurse Midwife

## 2023-09-23 ENCOUNTER — Other Ambulatory Visit (HOSPITAL_BASED_OUTPATIENT_CLINIC_OR_DEPARTMENT_OTHER): Payer: Self-pay | Admitting: *Deleted

## 2023-09-23 DIAGNOSIS — N939 Abnormal uterine and vaginal bleeding, unspecified: Secondary | ICD-10-CM

## 2023-09-23 NOTE — Progress Notes (Signed)
 [place] order entered KD

## 2023-09-24 ENCOUNTER — Ambulatory Visit (HOSPITAL_BASED_OUTPATIENT_CLINIC_OR_DEPARTMENT_OTHER): Payer: PRIVATE HEALTH INSURANCE | Admitting: Obstetrics & Gynecology

## 2023-09-24 ENCOUNTER — Other Ambulatory Visit (HOSPITAL_BASED_OUTPATIENT_CLINIC_OR_DEPARTMENT_OTHER): Payer: PRIVATE HEALTH INSURANCE

## 2023-09-24 VITALS — BP 145/91 | HR 101 | Ht 65.5 in | Wt 152.0 lb

## 2023-09-24 DIAGNOSIS — Z79890 Hormone replacement therapy (postmenopausal): Secondary | ICD-10-CM

## 2023-09-24 DIAGNOSIS — N939 Abnormal uterine and vaginal bleeding, unspecified: Secondary | ICD-10-CM

## 2023-09-24 DIAGNOSIS — N950 Postmenopausal bleeding: Secondary | ICD-10-CM | POA: Diagnosis not present

## 2023-09-24 MED ORDER — PROGESTERONE 200 MG PO CAPS: ORAL_CAPSULE | ORAL | 2 refills | Status: DC

## 2023-09-24 NOTE — Patient Instructions (Signed)
 Stop the norethindrone that you are taking.  Continue to take the estradiol 1.0mg  daily.  Time of day doesn't matter.  Restart the prometrium (progesterone) 200mg  nightly.  You need to take this 1/2 of each month.  Because of the current bleeding, stop all progesterone now and restart on day 15.  Take for 15 days each month.

## 2023-09-27 ENCOUNTER — Encounter (HOSPITAL_BASED_OUTPATIENT_CLINIC_OR_DEPARTMENT_OTHER): Payer: Self-pay | Admitting: Obstetrics & Gynecology

## 2023-09-27 NOTE — Progress Notes (Signed)
 GYNECOLOGY  VISIT  CC:   Discuss ultrasound results, PMP bleeding on HRT  HPI: 53 y.o. G3P0012 Married White or Caucasian female here for discussion of ultrasound results.  Ultrasound obtained due to irregular PMP bleeding while on HRT.  She is currently on estradiol 1.0mg  daily and norethindrone 0.35mg  daily as well as norethindrone 5mg  daily.  Had Pap and endometrial biopsy obtained on 09/12/2023.  Pathology was negative for abnormal pathology.  Possible hormonal effect was seen.    Ultrasound showed normal uterus.  Endometrium of 2.4mm.  Ovaries were normal.  No free fluid.  Cervix was normal.    Feel HRT needs to be adjusted so she is taking progesterone days 1-15 each month so she will have withdrawal bleeding.  Dosing and medication administration reviewed.  If bleeding continues, would then recommend D&C.  She has appt already scheduled at the end of the month.   Past Medical History:  Diagnosis Date   Anxiety    Bipolar disorder (HCC)    COVID-19    Depression    Hypertension    Migraine with aura    Psoriatic arthritis (HCC)     MEDS:   Current Outpatient Medications on File Prior to Visit  Medication Sig Dispense Refill   ALPRAZolam (XANAX) 1 MG tablet Take 1 mg by mouth 3 (three) times daily as needed.     amphetamine-dextroamphetamine (ADDERALL XR) 30 MG 24 hr capsule Take 15 mg by mouth daily.     CAPLYTA 42 MG capsule Take 42 mg by mouth at bedtime.     diclofenac (VOLTAREN) 75 MG EC tablet Take 75 mg by mouth 2 (two) times daily as needed.     estradiol (ESTRACE) 1 MG tablet Take 1 tablet (1 mg total) by mouth daily. 90 tablet 3   NONFORMULARY OR COMPOUNDED ITEM Topical testosterone cream 1mg /0.8ml.  Apply topically 0.50ml topically daily (arms/legs/abdomen)  Disp: 3 month supply.  #1RF. 1 each 1   prazosin (MINIPRESS) 2 MG capsule Take 2 mg by mouth at bedtime.     pregabalin (LYRICA) 50 MG capsule Take 50 mg by mouth 2 (two) times daily.     topiramate (TOPAMAX) 25  MG tablet Take 75 mg by mouth at bedtime.     traZODone (DESYREL) 50 MG tablet Take 50 mg by mouth at bedtime.     No current facility-administered medications on file prior to visit.    ALLERGIES: Sulfa antibiotics and Lamictal [lamotrigine]  SH:  married, non smoker  Review of Systems  Constitutional: Negative.   Genitourinary:        Irregular bleeding    PHYSICAL EXAMINATION:    BP (!) 145/91 (BP Location: Left Arm, Patient Position: Sitting, Cuff Size: Normal)   Pulse (!) 101   Ht 5' 5.5" (1.664 m)   Wt 152 lb (68.9 kg)   BMI 24.91 kg/m      Physical Exam Constitutional:      Appearance: Normal appearance.  Neurological:     General: No focal deficit present.     Mental Status: She is alert.  Psychiatric:        Mood and Affect: Mood normal.     Assessment/Plan: 1. Postmenopausal bleeding (Primary) - ultrasound reassuring today.  Negative endometrial biopsy obtained 09/12/2023.  2. Hormone replacement therapy (HRT) - will change progesterone to days 1-15 each months.  Norethindrone removed from medication list. - new rx to pharmacy.  Written instructions provided to pt. - progesterone (PROMETRIUM) 200 MG capsule; Take  nightly days 15- 30 each month.  Dispense: 45 capsule; Refill: 2 - recheck 1 month - consider D&C if bleeding continues

## 2023-10-06 ENCOUNTER — Ambulatory Visit (HOSPITAL_BASED_OUTPATIENT_CLINIC_OR_DEPARTMENT_OTHER): Payer: PRIVATE HEALTH INSURANCE | Admitting: Obstetrics & Gynecology

## 2023-10-09 ENCOUNTER — Encounter (HOSPITAL_BASED_OUTPATIENT_CLINIC_OR_DEPARTMENT_OTHER): Payer: Self-pay | Admitting: Certified Nurse Midwife

## 2023-10-22 ENCOUNTER — Ambulatory Visit (HOSPITAL_BASED_OUTPATIENT_CLINIC_OR_DEPARTMENT_OTHER): Payer: Self-pay | Admitting: Obstetrics & Gynecology

## 2023-11-26 ENCOUNTER — Ambulatory Visit (HOSPITAL_BASED_OUTPATIENT_CLINIC_OR_DEPARTMENT_OTHER): Payer: Self-pay | Admitting: Obstetrics & Gynecology

## 2024-04-16 ENCOUNTER — Ambulatory Visit (HOSPITAL_BASED_OUTPATIENT_CLINIC_OR_DEPARTMENT_OTHER): Payer: PRIVATE HEALTH INSURANCE | Admitting: Obstetrics & Gynecology

## 2024-06-28 ENCOUNTER — Ambulatory Visit (HOSPITAL_BASED_OUTPATIENT_CLINIC_OR_DEPARTMENT_OTHER): Payer: PRIVATE HEALTH INSURANCE | Admitting: Obstetrics & Gynecology

## 2024-06-28 ENCOUNTER — Encounter (HOSPITAL_BASED_OUTPATIENT_CLINIC_OR_DEPARTMENT_OTHER): Payer: Self-pay | Admitting: Obstetrics & Gynecology

## 2024-06-28 VITALS — BP 105/68 | HR 79 | Ht 65.0 in | Wt 162.6 lb

## 2024-06-28 DIAGNOSIS — Z8669 Personal history of other diseases of the nervous system and sense organs: Secondary | ICD-10-CM

## 2024-06-28 DIAGNOSIS — Z01419 Encounter for gynecological examination (general) (routine) without abnormal findings: Secondary | ICD-10-CM

## 2024-06-28 DIAGNOSIS — L4050 Arthropathic psoriasis, unspecified: Secondary | ICD-10-CM | POA: Diagnosis not present

## 2024-06-28 DIAGNOSIS — F319 Bipolar disorder, unspecified: Secondary | ICD-10-CM

## 2024-06-28 DIAGNOSIS — Z79890 Hormone replacement therapy (postmenopausal): Secondary | ICD-10-CM | POA: Diagnosis not present

## 2024-06-28 NOTE — Progress Notes (Signed)
 "  ANNUAL EXAM Patient name: Courtney Valentine MRN [id number]  Date of birth: 1971-03-06 Chief Complaint:   Annual Exam  History of Present Illness:   Courtney Valentine is a 54 y.o. [phone] Caucasian female being seen today for a routine annual exam.  She is continuing to have brain fog with her menopausal symptoms.  She is on estradiol  1.0mg  and progesterone  200mg  days 1-15 days.  She does bleeding for about 6 days when she stops the progesterone .    H/o ADD and is on Adderal 30mg  XR.  This is managed by Dr. Vincente and they did discussed this with her last appt.    Saw PCP, Camie Mirza, in December.  Blood work reviewed in [facility].    Had pap and endometrial biopsy 08/2023.  Results showed benign endometrium.    Patient's last menstrual period was 06/01/2024 (approximate).  Last pap 09/12/2023. Results were: NILM w/ HRHPV negative. H/O abnormal pap: no Last mammogram: 01/14/2024. Results were: normal. Family h/o breast cancer: no Last colonoscopy: declines but did cologuard 11/2022.  Negative.  Repeat in 3 years.       06/28/2024    9:40 AM 04/07/2023    2:37 PM 01/22/2023   11:42 AM 10/03/2022    2:06 PM  Depression screen PHQ 2/9  Decreased Interest 0 0 0 0  Down, Depressed, Hopeless 0 0 0 0  PHQ - 2 Score 0 0 0 0  Altered sleeping 0     Tired, decreased energy 0     Change in appetite 0     Feeling bad or failure about yourself  0     Trouble concentrating 0     Moving slowly or fidgety/restless 0     Suicidal thoughts 0     PHQ-9 Score 0     Difficult doing work/chores Not difficult at all           06/28/2024    9:41 AM  GAD 7 : Generalized Anxiety Score  Nervous, Anxious, on Edge 0  Control/stop worrying 0  Worry too much - different things 0  Trouble relaxing 0  Restless 0  Easily annoyed or irritable 0  Afraid - awful might happen 0  Total GAD 7 Score 0  Anxiety Difficulty Not difficult at all     Review of Systems:   Pertinent items are noted in  HPI Denies any urinary or bowel changes.  Denies pelvic pain.   Pertinent History Reviewed:  Reviewed past medical,surgical, social and family history.  Reviewed problem list, medications and allergies. Physical Assessment:   Vitals:   06/28/24 0937  BP: 105/68  Pulse: 79  Weight: 162 lb 9.6 oz (73.8 kg)  Height: 5' 5 (1.651 m)  Body mass index is 27.06 kg/m.        Physical Examination:   General appearance - well appearing, and in no distress  Mental status - alert, oriented to person, place, and time  Psych:  She has a normal mood and affect  Skin - warm and dry, normal color, no suspicious lesions noted  Chest - effort normal, all lung fields clear to auscultation bilaterally  Heart - normal rate and regular rhythm  Neck:  midline trachea, no thyromegaly or nodules  Breasts - breasts appear normal, no suspicious masses, no skin or nipple changes or  axillary nodes  Abdomen - soft, nontender, nondistended, no masses or organomegaly  Pelvic - VULVA: normal appearing vulva with no masses, tenderness or lesions  VAGINA: normal appearing vagina with normal color and discharge, no lesions   CERVIX: normal appearing cervix without discharge or lesions, no CMT  Thin prep pap is not indicated  UTERUS: uterus is felt to be normal size, shape, consistency and nontender   ADNEXA: No adnexal masses or tenderness noted.  Rectal - normal rectal, good sphincter tone, no masses felt  Extremities:  No swelling or varicosities noted  Chaperone present for exam  No results found for this or any previous visit (from the past 24 hours).  Assessment & Plan:  1. Well woman exam with routine gynecological exam (Primary) - Pap smear neg with neg HR HPV 08/2023 - Mammogram 12/2023 - Colonoscopy declined.  Cologuard 2024 - Bone mineral density not indicated - lab work done with PCP - vaccines reviewed/updated  2. Hormone replacement therapy (HRT) - on estradiol  1mg  daily and progesterone   200mg  days 1-15.   - Estradiol   3. Bipolar disease, chronic (HCC)  4. History of migraine headaches  5. Psoriatic arthritis (HCC) - followed by Dr. Mai   Orders Placed This Encounter  Procedures   Estradiol     Meds: No orders of the defined types were placed in this encounter.   Follow-up: Return in about 1 year (around 06/28/2025).  Ronal GORMAN Pinal, MD 06/28/2024 10:24 AM "

## 2024-06-29 LAB — ESTRADIOL: Estradiol: 159 pg/mL

## 2024-07-03 ENCOUNTER — Other Ambulatory Visit (HOSPITAL_BASED_OUTPATIENT_CLINIC_OR_DEPARTMENT_OTHER): Payer: Self-pay | Admitting: Obstetrics & Gynecology

## 2024-07-03 DIAGNOSIS — Z79890 Hormone replacement therapy (postmenopausal): Secondary | ICD-10-CM

## 2024-07-29 ENCOUNTER — Other Ambulatory Visit (HOSPITAL_BASED_OUTPATIENT_CLINIC_OR_DEPARTMENT_OTHER): Payer: Self-pay | Admitting: Certified Nurse Midwife
